# Patient Record
Sex: Male | Born: 1960
Health system: Southern US, Community
[De-identification: ages and names within clinical notes are randomized; demographics above are authoritative.]

## PROBLEM LIST (undated history)

## (undated) DIAGNOSIS — B029 Zoster without complications: Secondary | ICD-10-CM

## (undated) DIAGNOSIS — F329 Major depressive disorder, single episode, unspecified: Secondary | ICD-10-CM

## (undated) DIAGNOSIS — B019 Varicella without complication: Secondary | ICD-10-CM

## (undated) DIAGNOSIS — T783XXA Angioneurotic edema, initial encounter: Secondary | ICD-10-CM

## (undated) DIAGNOSIS — L509 Urticaria, unspecified: Secondary | ICD-10-CM

## (undated) DIAGNOSIS — E7800 Pure hypercholesterolemia, unspecified: Secondary | ICD-10-CM

## (undated) DIAGNOSIS — K589 Irritable bowel syndrome without diarrhea: Secondary | ICD-10-CM

## (undated) DIAGNOSIS — K760 Fatty (change of) liver, not elsewhere classified: Secondary | ICD-10-CM

## (undated) DIAGNOSIS — N200 Calculus of kidney: Secondary | ICD-10-CM

## (undated) DIAGNOSIS — R221 Localized swelling, mass and lump, neck: Secondary | ICD-10-CM

## (undated) DIAGNOSIS — M1990 Unspecified osteoarthritis, unspecified site: Secondary | ICD-10-CM

## (undated) DIAGNOSIS — C679 Malignant neoplasm of bladder, unspecified: Secondary | ICD-10-CM

## (undated) DIAGNOSIS — F32A Depression, unspecified: Secondary | ICD-10-CM

## (undated) DIAGNOSIS — R7303 Prediabetes: Secondary | ICD-10-CM

## (undated) DIAGNOSIS — R220 Localized swelling, mass and lump, head: Secondary | ICD-10-CM

## (undated) DIAGNOSIS — J45909 Unspecified asthma, uncomplicated: Secondary | ICD-10-CM

## (undated) HISTORY — DX: Localized swelling, mass and lump, neck: R22.1

## (undated) HISTORY — DX: Depression, unspecified: F32.A

## (undated) HISTORY — DX: Unspecified asthma, uncomplicated: J45.909

## (undated) HISTORY — DX: Zoster without complications: B02.9

## (undated) HISTORY — DX: Calculus of kidney: N20.0

## (undated) HISTORY — DX: Urticaria, unspecified: L50.9

## (undated) HISTORY — DX: Fatty (change of) liver, not elsewhere classified: K76.0

## (undated) HISTORY — DX: Varicella without complication: B01.9

## (undated) HISTORY — DX: Angioneurotic edema, initial encounter: T78.3XXA

## (undated) HISTORY — DX: Localized swelling, mass and lump, head: R22.0

## (undated) HISTORY — DX: Malignant neoplasm of bladder, unspecified: C67.9

## (undated) HISTORY — DX: Prediabetes: R73.03

---
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
## 1986-01-01 HISTORY — PX: OTHER SURGICAL HISTORY: SHX169

## 2008-09-10 ENCOUNTER — Ambulatory Visit (HOSPITAL_COMMUNITY): Admission: RE | Admit: 2008-09-10 | Discharge: 2008-09-10 | Payer: Self-pay | Admitting: Pulmonary Disease

## 2011-07-16 ENCOUNTER — Other Ambulatory Visit (HOSPITAL_COMMUNITY): Payer: Self-pay | Admitting: Family Medicine

## 2011-07-16 ENCOUNTER — Ambulatory Visit (HOSPITAL_COMMUNITY)
Admission: RE | Admit: 2011-07-16 | Discharge: 2011-07-16 | Disposition: A | Discharge status: Home / Self Care | Payer: BC Managed Care – PPO | Source: Ambulatory Visit | Attending: Family Medicine | Admitting: Family Medicine

## 2011-07-16 DIAGNOSIS — R0602 Shortness of breath: Secondary | ICD-10-CM

## 2011-07-16 DIAGNOSIS — R42 Dizziness and giddiness: Secondary | ICD-10-CM | POA: Insufficient documentation

## 2012-09-05 ENCOUNTER — Encounter (HOSPITAL_COMMUNITY): Payer: Self-pay | Admitting: Pharmacy Technician

## 2012-09-07 NOTE — H&P (Signed)
Lee Hughes is an 52 y.o. male.   Chief Complaint: Need for screening colonoscopy HPI: Patient is a 52 year old white male who presents for a screening colonoscopy. He denies any GI complaints. There is no family history of colon cancer.  No past medical history on file.  No past surgical history on file.  No family history on file. Social History:  has no tobacco, alcohol, and drug history on file.  Allergies: No Known Allergies  No prescriptions prior to admission    No results found for this or any previous visit (from the past 48 hour(s)). No results found.  Review of Systems  Constitutional: Negative.   Gastrointestinal: Negative.   All other systems reviewed and are negative.    There were no vitals taken for this visit. Physical Exam  Constitutional: He is oriented to person, place, and time. He appears well-developed and well-nourished.  HENT:  Head: Normocephalic and atraumatic.  Neck: Normal range of motion. Neck supple.  Cardiovascular: Normal rate, regular rhythm and normal heart sounds.   Respiratory: Effort normal and breath sounds normal.  GI: Soft. Bowel sounds are normal.  Neurological: He is alert and oriented to person, place, and time.  Skin: Skin is warm and dry.     Assessment/Plan Impression: Need for screening colonoscopy Plan: Scheduled for screening colonoscopy on 09/09/2012. The risks and benefits of the procedure were fully explained to the patient, who gave informed consent.  Lee Hughes A 09/07/2012, 11:02 AM

## 2012-09-09 ENCOUNTER — Ambulatory Visit (HOSPITAL_COMMUNITY)
Admission: RE | Admit: 2012-09-09 | Discharge: 2012-09-09 | Disposition: A | Discharge status: Home / Self Care | Payer: BC Managed Care – PPO | Source: Ambulatory Visit | Attending: General Surgery | Admitting: General Surgery

## 2012-09-09 ENCOUNTER — Encounter (HOSPITAL_COMMUNITY): Payer: Self-pay | Admitting: *Deleted

## 2012-09-09 ENCOUNTER — Encounter (HOSPITAL_COMMUNITY)
Admission: RE | Disposition: A | Discharge status: Home / Self Care | Payer: Self-pay | Source: Ambulatory Visit | Attending: General Surgery

## 2012-09-09 DIAGNOSIS — Z1211 Encounter for screening for malignant neoplasm of colon: Secondary | ICD-10-CM | POA: Insufficient documentation

## 2012-09-09 HISTORY — DX: Pure hypercholesterolemia, unspecified: E78.00

## 2012-09-09 HISTORY — PX: COLONOSCOPY: SHX5424

## 2012-09-09 HISTORY — DX: Irritable bowel syndrome, unspecified: K58.9

## 2012-09-09 SURGERY — COLONOSCOPY
Anesthesia: Moderate Sedation

## 2012-09-09 MED ORDER — MEPERIDINE HCL 50 MG/ML IJ SOLN
INTRAMUSCULAR | Status: DC | PRN
  Administered 2012-09-09: 50 mg via INTRAVENOUS

## 2012-09-09 MED ORDER — STERILE WATER FOR IRRIGATION IR SOLN
Status: DC | PRN
  Administered 2012-09-09: 09:00:00

## 2012-09-09 MED ORDER — MEPERIDINE HCL 100 MG/ML IJ SOLN
INTRAMUSCULAR | Status: AC
  Filled 2012-09-09: qty 1

## 2012-09-09 MED ORDER — MIDAZOLAM HCL 5 MG/5ML IJ SOLN
INTRAMUSCULAR | Status: AC
  Filled 2012-09-09: qty 10

## 2012-09-09 MED ORDER — MIDAZOLAM HCL 5 MG/5ML IJ SOLN
INTRAMUSCULAR | Status: DC | PRN
  Administered 2012-09-09: 4 mg via INTRAVENOUS

## 2012-09-09 MED ORDER — SODIUM CHLORIDE 0.9 % IV SOLN
INTRAVENOUS | Status: DC
  Administered 2012-09-09: 08:00:00 via INTRAVENOUS

## 2012-09-09 NOTE — Op Note (Signed)
[facility] [address] [place], [address]   COLONOSCOPY PROCEDURE REPORT  PATIENT: Lee Hughes, Lee Hughes  MR#: [id number] BIRTHDATE: [birth year] , 52  yrs. old GENDER: Male ENDOSCOPIST: Franky Macho, MD REFERRED XB:[id number], John PROCEDURE DATE:  09/09/2012 PROCEDURE:   Colonoscopy, screening ASA CLASS:   Class I INDICATIONS:Average risk patient for colon cancer. MEDICATIONS: Versed 4 mg IV and Demerol 50 mg IV  DESCRIPTION OF PROCEDURE:   After the risks benefits and alternatives of the procedure were thoroughly explained, informed consent was obtained.  A digital rectal exam revealed no abnormalities of the rectum.   The EC-[id number] ([id number])  endoscope was introduced through the anus and advanced to the cecum, which was identified by both the appendix and ileocecal valve. No adverse events experienced.   The quality of the prep was adequate, using MoviPrep  The instrument was then slowly withdrawn as the colon was fully examined.      COLON FINDINGS: A normal appearing cecum, ileocecal valve, and appendiceal orifice were identified.  The ascending, hepatic flexure, transverse, splenic flexure, descending, sigmoid colon and rectum appeared unremarkable.  No polyps or cancers were seen. Retroflexed views revealed no abnormalities. The time to cecum=4 minutes 0 seconds.  Withdrawal time=5 minutes 0 seconds.  The scope was withdrawn and the procedure completed. COMPLICATIONS: There were no complications.  ENDOSCOPIC IMPRESSION: Normal colon  RECOMMENDATIONS: Repeat Colonscopy in 10 years.   eSigned:  Franky Macho, MD 09/09/2012 8:51 AM   cc:

## 2012-09-09 NOTE — Interval H&P Note (Signed)
History and Physical Interval Note:  09/09/2012 8:39 AM  Lee Hughes  has presented today for surgery, with the diagnosis of screening  The various methods of treatment have been discussed with the patient and family. After consideration of risks, benefits and other options for treatment, the patient has consented to  Procedure(s): COLONOSCOPY (N/A) as a surgical intervention .  The patient's history has been reviewed, patient examined, no change in status, stable for surgery.  I have reviewed the patient's chart and labs.  Questions were answered to the patient's satisfaction.     Franky Macho A

## 2012-09-10 ENCOUNTER — Encounter (HOSPITAL_COMMUNITY): Payer: Self-pay | Admitting: General Surgery

## 2013-05-29 ENCOUNTER — Other Ambulatory Visit (HOSPITAL_COMMUNITY): Payer: Self-pay | Admitting: Family Medicine

## 2013-05-29 DIAGNOSIS — Q619 Cystic kidney disease, unspecified: Secondary | ICD-10-CM

## 2013-06-15 ENCOUNTER — Ambulatory Visit (HOSPITAL_COMMUNITY)
Admission: RE | Admit: 2013-06-15 | Discharge: 2013-06-15 | Disposition: A | Discharge status: Home / Self Care | Payer: BC Managed Care – PPO | Source: Ambulatory Visit | Attending: Family Medicine | Admitting: Family Medicine

## 2013-06-15 ENCOUNTER — Encounter (HOSPITAL_COMMUNITY): Payer: Self-pay

## 2013-06-15 DIAGNOSIS — Q619 Cystic kidney disease, unspecified: Secondary | ICD-10-CM

## 2013-06-15 DIAGNOSIS — K7689 Other specified diseases of liver: Secondary | ICD-10-CM | POA: Insufficient documentation

## 2013-06-15 DIAGNOSIS — N289 Disorder of kidney and ureter, unspecified: Secondary | ICD-10-CM | POA: Insufficient documentation

## 2013-06-15 DIAGNOSIS — Q6101 Congenital single renal cyst: Secondary | ICD-10-CM | POA: Insufficient documentation

## 2013-06-15 MED ORDER — IOHEXOL 300 MG/ML  SOLN
100.0000 mL | Freq: Once | INTRAMUSCULAR | Status: AC | PRN
  Administered 2013-06-15: 100 mL via INTRAVENOUS

## 2015-01-31 DIAGNOSIS — E7801 Familial hypercholesterolemia: Secondary | ICD-10-CM | POA: Insufficient documentation

## 2015-05-02 DIAGNOSIS — Z6827 Body mass index (BMI) 27.0-27.9, adult: Secondary | ICD-10-CM | POA: Diagnosis not present

## 2015-05-02 DIAGNOSIS — E781 Pure hyperglyceridemia: Secondary | ICD-10-CM | POA: Diagnosis not present

## 2015-05-02 DIAGNOSIS — N200 Calculus of kidney: Secondary | ICD-10-CM | POA: Diagnosis not present

## 2015-05-02 DIAGNOSIS — E663 Overweight: Secondary | ICD-10-CM | POA: Diagnosis not present

## 2015-05-02 DIAGNOSIS — Z1389 Encounter for screening for other disorder: Secondary | ICD-10-CM | POA: Diagnosis not present

## 2015-05-04 MED FILL — FENOFIBRATE 160 MG TABLET: 160 | 90 days supply | Qty: 90 | Fill #0

## 2015-05-13 MED FILL — PIOGLITAZONE HCL 30 MG TAB: 30 | 90 days supply | Qty: 90 | Fill #0

## 2015-05-13 MED FILL — OSCIMIN SR 0.375 MG TABLET: 0.375 | 60 days supply | Qty: 90 | Fill #0

## 2015-05-13 MED FILL — OMEGA-3 ETHYL ESTERS 1 GM C: 1 | 90 days supply | Qty: 180 | Fill #0

## 2015-05-17 MED FILL — NIACIN FLUSH FREE 500 MG CA: 400(500MG) | 20 days supply | Qty: 120 | Fill #0

## 2016-01-06 MED FILL — ROSUVASTATIN CALCIUM 5 MG T: 5 | 90 days supply | Qty: 90 | Fill #0

## 2016-01-06 MED FILL — OMEGA-3 ETHYL ESTERS 1 GM C: 1 | 90 days supply | Qty: 180 | Fill #1

## 2016-01-06 MED FILL — FENOFIBRATE 160 MG TABLET: 160 | 90 days supply | Qty: 90 | Fill #1

## 2016-01-06 MED FILL — PIOGLITAZONE HCL 30 MG TAB: 30 | 90 days supply | Qty: 90 | Fill #1

## 2016-01-06 MED FILL — OSCIMIN SR 0.375 MG TABLET: 0.375 | 90 days supply | Qty: 90 | Fill #1

## 2016-02-15 ENCOUNTER — Ambulatory Visit: Payer: Self-pay | Admitting: Allergy and Immunology

## 2016-03-19 ENCOUNTER — Ambulatory Visit (INDEPENDENT_AMBULATORY_CARE_PROVIDER_SITE_OTHER): Payer: 59 | Admitting: Allergy

## 2016-03-19 ENCOUNTER — Encounter: Payer: Self-pay | Admitting: Allergy

## 2016-03-19 VITALS — BP 114/72 | HR 86 | Temp 97.7°F | Resp 14 | Ht 71.0 in | Wt 203.0 lb

## 2016-03-19 DIAGNOSIS — T783XXA Angioneurotic edema, initial encounter: Secondary | ICD-10-CM

## 2016-03-19 DIAGNOSIS — L508 Other urticaria: Secondary | ICD-10-CM | POA: Diagnosis not present

## 2016-03-19 NOTE — Patient Instructions (Signed)
Hives and swelling       - at this time no identifiable trigger to hives and swelling.   This could be related to starting Crestor however with drug allergy would expect your to have daily symptoms within hours of taking the medications if you take it daily.  Would however recommend discussing with your PCP regarding continuation if below antihistamine regimen does not decrease your symptoms.          - will obtain following labs: CBC w diff, CMP, tryptase level, alpha gal panel, chronic hive panel and environmental allergen panel      - start a daily antihistamine like Zyrtec 10mg , Allergra 180mg  or Xyzal 5mg  daily.   If you continue to have hives and swelling increase to twice a day dosing (in AM and PM).   If twice a dosing is not enough add in either Zantac 150mg  or Pepcid 20mg  twice a day with your antihistamine.    Follow-up 2-3 months

## 2016-03-19 NOTE — Progress Notes (Signed)
New Patient Note  RE: Lee Hughes MRN: [id number] DOB: Jun 20, 1960 Date of Office Visit: 03/19/2016  Referring provider: Sharilyn Sites, MD Primary care provider: Purvis Kilts, MD  Chief Complaint: rash and swelling  History of present illness: Lee Hughes is a 56 y.o. male presenting today for consultation for hives and swelling.  He presents today with his wife.  His symptoms started in November 2017 with red raised itchy rash.  He is also on 4 separate occasions and has had lip and tongue swelling, runny nose and itchy eyes along with the rash.  He states he will have the rash several times a week.  He has only taking Benadryl when he has had swelling which does improve the swelling within about 30 minutes. He denies any trouble swallowing or breathing and no respiratory, GI or CV related symptoms.  Has has noticed that shower with warm-hot water makes the rash worse He has changed his soaps and detergents to fragrance-free and dye-free products and he continues to have the rash.  He is also concerned that he is reacting to Crestor as this wasn't restarted in November. He states he took the medicine for about a week before the rash started. He continues to take Crestor daily.  He was previously taking Repatha.  He denies any preceding illnessed, new foods, or stings.  No associated fevers, joint aches or pains and rash does not leave any marks or bruising.  He denies known tick bites but does work outdoors frequently.  He denies having hives or swelling prior to November.  He has provided pictures of the rash that is consistent with classic urticaria.   He has a history of childhood asthma.  Denies any significant ocular or nasal symptoms suggestive of allergic rhinoconjunctivitis outside of the 4 episodes as above.  Review of systems: Review of Systems  Constitutional: Negative for chills, fever and malaise/fatigue.  HENT: Positive for congestion. Negative for ear discharge, ear  pain, nosebleeds, sinus pain, sore throat and tinnitus.   Eyes: Negative for discharge and redness.  Respiratory: Negative for cough, shortness of breath and wheezing.   Cardiovascular: Negative for chest pain.  Gastrointestinal: Negative for abdominal pain, heartburn, nausea and vomiting.  Musculoskeletal: Negative for joint pain and myalgias.  Skin: Positive for itching and rash.  Neurological: Negative for headaches.    All other systems negative unless noted above in HPI  Past medical history: Past Medical History:  Diagnosis Date  . Angio-edema   . Asthma    as child, no longer problematic  . Hypercholesteremia   . Irritable bowel syndrome   . Throat swelling   . Tongue swelling   . Urticaria     Past surgical history: Past Surgical History:  Procedure Laterality Date  . COLONOSCOPY N/A 09/09/2012   Procedure: COLONOSCOPY;  Surgeon: Jamesetta So, MD;  Location: [facility];  Service: Gastroenterology;  Laterality: N/A;  . Left femur Left 1988    Family history:  Family History  Problem Relation Age of Onset  . Pancreatic cancer Brother   . Pancreatic cancer Brother   . Allergic rhinitis Neg Hx   . Angioedema Neg Hx   . Asthma Neg Hx   . Atopy Neg Hx   . Eczema Neg Hx   . Immunodeficiency Neg Hx   . Urticaria Neg Hx     Social history: He lives in a home without carpeting with central cooling. There is a cat in the home.  There is no concern for water damage, mildew or roaches the home. He works as a Estate manager/land agent. He has no history of tobacco use.  Medication List: Allergies as of 03/19/2016   No Known Allergies     Medication List       Accurate as of 03/19/16 10:28 AM. Always use your most recent med list.          aspirin 81 MG chewable tablet Chew 81 mg by mouth.   fenofibrate 160 MG tablet   LEVBID 0.375 MG 12 hr tablet Generic drug:  hyoscyamine Take 0.375 mg by mouth daily.   multivitamin capsule Take by mouth.   niacin 1000 MG CR  tablet Commonly known as:  NIASPAN Take 3,000 mg by mouth.   omega-3 acid ethyl esters 1 g capsule Commonly known as:  LOVAZA Take 2 g by mouth.   pioglitazone 30 MG tablet Commonly known as:  ACTOS Take 30 mg by mouth.   rosuvastatin 5 MG tablet Commonly known as:  CRESTOR       Known medication allergies: No Known Allergies   Physical examination: Blood pressure 114/72, pulse 86, temperature 97.7 F (36.5 C), temperature source Oral, resp. rate 14, height 5\' 11"  (1.803 m), weight 203 lb (92.1 kg), SpO2 97 %.  General: Alert, interactive, in no acute distress. HEENT: TMs pearly gray, turbinates minimally edematous without discharge, post-pharynx non erythematous. Neck: Supple without lymphadenopathy. Lungs: Clear to auscultation without wheezing, rhonchi or rales. {no increased work of breathing. CV: Normal S1, S2 without murmurs. Abdomen: Nondistended, nontender. Skin: Scattered erythematous urticarial type lesions primarily located Arms, neck and upper back , nonvesicular. Extremities:  No clubbing, cyanosis or edema. Neuro:   Grossly intact.  Diagnositics/Labs:  Allergy testing: Deferred due to ongoing urticaria   Assessment and plan: Urticaria with angioedema      - at this time no identifiable trigger to hives and swelling.   This could be related to starting Crestor however with drug allergy would expect you to have daily symptoms within hours of taking the medications if you take it daily.  Would however recommend discussing with your PCP regarding continuation if below antihistamine regimen does not decrease your symptoms.          - will obtain following labs: CBC w diff, CMP, tryptase level, alpha gal panel, chronic hive panel and environmental allergen panel      - start a daily antihistamine like Zyrtec 10mg , Allergra 180mg  or Xyzal 5mg  daily.   If you continue to have hives and swelling increase to twice a day dosing (in AM and PM).   If twice a dosing is  not enough add in either Zantac 150mg  or Pepcid 20mg  twice a day with your antihistamine.    Follow-up 2-3 months   I appreciate the opportunity to take part in Lee Hughes's care. Please do not hesitate to contact me with questions.  Sincerely,   Prudy Feeler, MD Allergy/Immunology [facility] [place]

## 2016-03-23 ENCOUNTER — Other Ambulatory Visit: Payer: Self-pay

## 2016-03-23 ENCOUNTER — Telehealth: Payer: Self-pay | Admitting: Allergy

## 2016-03-23 LAB — CBC WITH DIFFERENTIAL/PLATELET
BASOS: 1 %
Basophils Absolute: 0 10*3/uL (ref 0.0–0.2)
EOS (ABSOLUTE): 0.3 10*3/uL (ref 0.0–0.4)
EOS: 6 %
HEMATOCRIT: 43.1 % (ref 37.5–51.0)
HEMOGLOBIN: 13.9 g/dL (ref 13.0–17.7)
IMMATURE GRANS (ABS): 0 10*3/uL (ref 0.0–0.1)
Immature Granulocytes: 0 %
Lymphocytes Absolute: 1.6 10*3/uL (ref 0.7–3.1)
Lymphs: 31 %
MCH: 25.8 pg — AB (ref 26.6–33.0)
MCHC: 32.3 g/dL (ref 31.5–35.7)
MCV: 80 fL (ref 79–97)
Monocytes Absolute: 0.5 10*3/uL (ref 0.1–0.9)
Monocytes: 9 %
NEUTROS ABS: 2.9 10*3/uL (ref 1.4–7.0)
Neutrophils: 53 %
Platelets: 297 10*3/uL (ref 150–379)
RBC: 5.39 x10E6/uL (ref 4.14–5.80)
RDW: 15.2 % (ref 12.3–15.4)
WBC: 5.4 10*3/uL (ref 3.4–10.8)

## 2016-03-23 LAB — ALLERGENS, ZONE 3
Cat Dander IgE: 34.2 kU/L — AB
Cladosporium Herbarum IgE: <0.1 kU/L
D Farinae IgE: <0.1 kU/L
D Pteronyssinus IgE: <0.1 kU/L
Dog Dander IgE: 13.8 kU/L — AB
Elm, American IgE: <0.1 kU/L
G002-IGE BERMUDA GRASS: 0.63 kU/L — AB
G008-IGE BLUEGRASS, KENTUCK: 1.47 kU/L — AB
G017-IGE BAHIA GRASS: 0.6 kU/L — AB
Hickory, White IgE: <0.1 kU/L
Johnson Grass IgE: 0.49 kU/L — AB
Oak, White IgE: 0.16 kU/L — AB
Penicillium Chrysogen IgE: <0.1 kU/L
Pigweed, Rough IgE: <0.1 kU/L
T003-IGE COMMON SILVER BIRCH: 0.53 kU/L — AB
T006-IGE CEDAR, MOUNTAIN: 1.85 kU/L — AB
White Mulberry IgE: <0.1 kU/L

## 2016-03-23 LAB — COMPREHENSIVE METABOLIC PANEL
A/G RATIO: 1.8 (ref 1.2–2.2)
ALT: 24 IU/L (ref 0–44)
AST: 28 IU/L (ref 0–40)
Albumin: 4.5 g/dL (ref 3.5–5.5)
Alkaline Phosphatase: 50 IU/L (ref 39–117)
BUN/Creatinine Ratio: 7 — ABNORMAL LOW (ref 9–20)
BUN: 9 mg/dL (ref 6–24)
Bilirubin Total: 0.3 mg/dL (ref 0.0–1.2)
CALCIUM: 9.9 mg/dL (ref 8.7–10.2)
CO2: 23 mmol/L (ref 18–29)
CREATININE: 1.27 mg/dL (ref 0.76–1.27)
Chloride: 102 mmol/L (ref 96–106)
GFR, EST AFRICAN AMERICAN: 74 mL/min/{1.73_m2} (ref >59)
GFR, EST NON AFRICAN AMERICAN: 64 mL/min/{1.73_m2} (ref >59)
GLOBULIN, TOTAL: 2.5 g/dL (ref 1.5–4.5)
GLUCOSE: 85 mg/dL (ref 65–99)
Potassium: 4.3 mmol/L (ref 3.5–5.2)
Sodium: 143 mmol/L (ref 134–144)
TOTAL PROTEIN: 7 g/dL (ref 6.0–8.5)

## 2016-03-23 LAB — ALPHA-GAL PANEL
Alpha Gal IgE*: <0.1 kU/L (ref <0.35)
BEEF CLASS INTERPRETATION: 1
Beef (Bos spp) IgE: 0.59 kU/L — ABNORMAL HIGH (ref <0.35)
LAMB CLASS INTERPRETATION: 3
LAMB/MUTTON (OVIS SPP) IGE: 7.91 kU/L — AB (ref <0.35)
PORK (SUS SPP) IGE: 8.81 kU/L — AB (ref <0.35)
PORK CLASS INTERPRETATION: 3

## 2016-03-23 LAB — TRYPTASE: Tryptase: 6.8 ug/L (ref 2.2–13.2)

## 2016-03-23 LAB — CHRONIC URTICARIA: cu index: 7.9 (ref <10)

## 2016-03-23 MED ORDER — EPINEPHRINE 0.3 MG/0.3ML IJ SOAJ: 0.3000 mg | Freq: Once | INTRAMUSCULAR | 2 refills | Status: AC

## 2016-03-23 MED FILL — EPINEPHRINE 0.3 MG AUTO-INJ: 0.3 | 30 days supply | Qty: 2 | Fill #0

## 2016-03-23 NOTE — Telephone Encounter (Signed)
Lm for pt to call us back about labs

## 2016-03-23 NOTE — Telephone Encounter (Signed)
Patient is returning a call. He doesn't know who called. He said it is probably about results.

## 2016-03-26 NOTE — Telephone Encounter (Signed)
See result notes. 

## 2016-04-03 MED FILL — ROSUVASTATIN CALCIUM 5 MG T: 5 | 90 days supply | Qty: 90 | Fill #1

## 2016-04-03 MED FILL — PIOGLITAZONE HCL 30 MG TAB: 30 | 90 days supply | Qty: 90 | Fill #2

## 2016-04-03 MED FILL — OSCIMIN SR 0.375 MG TABLET: 0.375 | 90 days supply | Qty: 90 | Fill #2

## 2016-04-03 MED FILL — FENOFIBRATE 160 MG TABLET: 160 | 90 days supply | Qty: 90 | Fill #2

## 2016-04-03 MED FILL — OMEGA-3 ETHYL ESTERS 1 GM C: 1 | 90 days supply | Qty: 180 | Fill #2

## 2016-05-24 ENCOUNTER — Other Ambulatory Visit: Payer: Self-pay | Admitting: General Surgery

## 2016-05-24 DIAGNOSIS — E7801 Familial hypercholesterolemia: Secondary | ICD-10-CM | POA: Diagnosis not present

## 2016-05-25 LAB — COMPREHENSIVE METABOLIC PANEL
ALT: 23 IU/L (ref 0–44)
AST: 29 IU/L (ref 0–40)
Albumin/Globulin Ratio: 1.7 (ref 1.2–2.2)
Albumin: 4.4 g/dL (ref 3.5–5.5)
Alkaline Phosphatase: 53 IU/L (ref 39–117)
BUN/Creatinine Ratio: 17 (ref 9–20)
BUN: 21 mg/dL (ref 6–24)
Bilirubin Total: 0.2 mg/dL (ref 0.0–1.2)
CALCIUM: 10.5 mg/dL — AB (ref 8.7–10.2)
CO2: 26 mmol/L (ref 18–29)
CREATININE: 1.23 mg/dL (ref 0.76–1.27)
Chloride: 102 mmol/L (ref 96–106)
GFR calc Af Amer: 77 mL/min/{1.73_m2} (ref >59)
GFR calc non Af Amer: 67 mL/min/{1.73_m2} (ref >59)
GLUCOSE: 89 mg/dL (ref 65–99)
Globulin, Total: 2.6 g/dL (ref 1.5–4.5)
Potassium: 4.5 mmol/L (ref 3.5–5.2)
Sodium: 142 mmol/L (ref 134–144)
Total Protein: 7 g/dL (ref 6.0–8.5)

## 2016-05-25 LAB — CBC WITH DIFFERENTIAL/PLATELET
BASOS ABS: 0 10*3/uL (ref 0.0–0.2)
Basos: 1 %
EOS (ABSOLUTE): 0.3 10*3/uL (ref 0.0–0.4)
EOS: 7 %
HEMATOCRIT: 43.5 % (ref 37.5–51.0)
Hemoglobin: 14 g/dL (ref 13.0–17.7)
IMMATURE GRANULOCYTES: 0 %
Immature Grans (Abs): 0 10*3/uL (ref 0.0–0.1)
Lymphocytes Absolute: 1.9 10*3/uL (ref 0.7–3.1)
Lymphs: 37 %
MCH: 25.9 pg — ABNORMAL LOW (ref 26.6–33.0)
MCHC: 32.2 g/dL (ref 31.5–35.7)
MCV: 80 fL (ref 79–97)
MONOS ABS: 0.5 10*3/uL (ref 0.1–0.9)
Monocytes: 10 %
NEUTROS PCT: 45 %
Neutrophils Absolute: 2.3 10*3/uL (ref 1.4–7.0)
PLATELETS: 300 10*3/uL (ref 150–379)
RBC: 5.41 x10E6/uL (ref 4.14–5.80)
RDW: 15.4 % (ref 12.3–15.4)
WBC: 5.1 10*3/uL (ref 3.4–10.8)

## 2016-05-25 LAB — HGB A1C W/O EAG: Hgb A1c MFr Bld: 5.6 % (ref 4.8–5.6)

## 2016-05-25 LAB — LIPID PANEL W/O CHOL/HDL RATIO
CHOLESTEROL TOTAL: 236 mg/dL — AB (ref 100–199)
HDL: 40 mg/dL (ref >39)
Triglycerides: 437 mg/dL — ABNORMAL HIGH (ref 0–149)

## 2016-05-30 DIAGNOSIS — D899 Disorder involving the immune mechanism, unspecified: Secondary | ICD-10-CM | POA: Diagnosis not present

## 2016-05-30 DIAGNOSIS — E7801 Familial hypercholesterolemia: Secondary | ICD-10-CM | POA: Diagnosis not present

## 2016-06-19 ENCOUNTER — Ambulatory Visit: Payer: Self-pay | Admitting: Allergy & Immunology

## 2016-06-19 DIAGNOSIS — J309 Allergic rhinitis, unspecified: Secondary | ICD-10-CM

## 2016-07-09 MED FILL — ROSUVASTATIN CALCIUM 5 MG T: 5 | 90 days supply | Qty: 90 | Fill #2

## 2016-07-11 MED FILL — PIOGLITAZONE HCL 30 MG TAB: 30 | 90 days supply | Qty: 90 | Fill #0

## 2016-07-11 MED FILL — OSCIMIN SR 0.375 MG TABLET: 0.375 | 90 days supply | Qty: 90 | Fill #0

## 2016-07-11 MED FILL — FENOFIBRATE 160 MG TABLET: 160 | 90 days supply | Qty: 90 | Fill #0

## 2016-07-11 MED FILL — OMEGA-3 ETHYL ESTERS 1 GM C: 1 | 90 days supply | Qty: 180 | Fill #0

## 2016-09-27 DIAGNOSIS — E781 Pure hyperglyceridemia: Secondary | ICD-10-CM | POA: Diagnosis not present

## 2016-09-27 DIAGNOSIS — Z6827 Body mass index (BMI) 27.0-27.9, adult: Secondary | ICD-10-CM | POA: Diagnosis not present

## 2016-09-27 DIAGNOSIS — E663 Overweight: Secondary | ICD-10-CM | POA: Diagnosis not present

## 2016-09-27 DIAGNOSIS — Z23 Encounter for immunization: Secondary | ICD-10-CM | POA: Diagnosis not present

## 2016-09-27 DIAGNOSIS — Z1389 Encounter for screening for other disorder: Secondary | ICD-10-CM | POA: Diagnosis not present

## 2016-09-27 DIAGNOSIS — Z0001 Encounter for general adult medical examination with abnormal findings: Secondary | ICD-10-CM | POA: Diagnosis not present

## 2016-09-27 DIAGNOSIS — R310 Gross hematuria: Secondary | ICD-10-CM | POA: Diagnosis not present

## 2016-09-27 DIAGNOSIS — E782 Mixed hyperlipidemia: Secondary | ICD-10-CM | POA: Diagnosis not present

## 2016-10-03 MED FILL — OSCIMIN SR 0.375 MG TABLET: 0.375 | 90 days supply | Qty: 90 | Fill #0

## 2016-10-08 MED FILL — ROSUVASTATIN CALCIUM 5 MG T: 5 | 90 days supply | Qty: 90 | Fill #0

## 2016-10-08 MED FILL — FENOFIBRATE 160 MG TABLET: 160 | 90 days supply | Qty: 90 | Fill #0

## 2016-10-08 MED FILL — PIOGLITAZONE HCL 30 MG TAB: 30 | 90 days supply | Qty: 90 | Fill #0

## 2016-10-08 MED FILL — OMEGA-3 ETHYL ESTERS 1 GM C: 1 | 90 days supply | Qty: 180 | Fill #0

## 2016-11-02 ENCOUNTER — Ambulatory Visit (INDEPENDENT_AMBULATORY_CARE_PROVIDER_SITE_OTHER): Payer: 59 | Admitting: Urology

## 2016-11-02 ENCOUNTER — Other Ambulatory Visit (HOSPITAL_COMMUNITY)
Admission: RE | Admit: 2016-11-02 | Discharge: 2016-11-02 | Disposition: A | Discharge status: Home / Self Care | Payer: 59 | Source: Other Acute Inpatient Hospital | Attending: Urology | Admitting: Urology

## 2016-11-02 DIAGNOSIS — N5201 Erectile dysfunction due to arterial insufficiency: Secondary | ICD-10-CM | POA: Diagnosis not present

## 2016-11-02 DIAGNOSIS — Z87442 Personal history of urinary calculi: Secondary | ICD-10-CM | POA: Diagnosis not present

## 2016-11-02 DIAGNOSIS — R310 Gross hematuria: Secondary | ICD-10-CM | POA: Diagnosis not present

## 2016-11-02 LAB — URINALYSIS, ROUTINE W REFLEX MICROSCOPIC
Bilirubin Urine: NEGATIVE
Glucose, UA: NEGATIVE mg/dL
KETONES UR: NEGATIVE mg/dL
Leukocytes, UA: NEGATIVE
Nitrite: NEGATIVE
PH: 5 (ref 5.0–8.0)
Protein, ur: NEGATIVE mg/dL
SPECIFIC GRAVITY, URINE: 1.018 (ref 1.005–1.030)
Squamous Epithelial / LPF: NONE SEEN

## 2016-11-06 ENCOUNTER — Other Ambulatory Visit: Payer: Self-pay | Admitting: Urology

## 2016-11-06 DIAGNOSIS — R310 Gross hematuria: Secondary | ICD-10-CM

## 2016-11-12 ENCOUNTER — Ambulatory Visit (HOSPITAL_COMMUNITY)
Admission: RE | Admit: 2016-11-12 | Discharge: 2016-11-12 | Disposition: A | Discharge status: Home / Self Care | Payer: 59 | Source: Ambulatory Visit | Attending: Urology | Admitting: Urology

## 2016-11-12 DIAGNOSIS — I700 Atherosclerosis of aorta: Secondary | ICD-10-CM | POA: Insufficient documentation

## 2016-11-12 DIAGNOSIS — N329 Bladder disorder, unspecified: Secondary | ICD-10-CM | POA: Insufficient documentation

## 2016-11-12 DIAGNOSIS — R310 Gross hematuria: Secondary | ICD-10-CM | POA: Diagnosis not present

## 2016-11-12 DIAGNOSIS — Z87442 Personal history of urinary calculi: Secondary | ICD-10-CM | POA: Insufficient documentation

## 2016-11-12 DIAGNOSIS — K5730 Diverticulosis of large intestine without perforation or abscess without bleeding: Secondary | ICD-10-CM | POA: Diagnosis not present

## 2016-11-12 DIAGNOSIS — N200 Calculus of kidney: Secondary | ICD-10-CM | POA: Insufficient documentation

## 2016-11-12 MED ORDER — IOPAMIDOL (ISOVUE-300) INJECTION 61%
125.0000 mL | Freq: Once | INTRAVENOUS | Status: AC | PRN
  Administered 2016-11-12: 125 mL via INTRAVENOUS

## 2016-11-16 ENCOUNTER — Other Ambulatory Visit (HOSPITAL_COMMUNITY)
Admission: RE | Admit: 2016-11-16 | Discharge: 2016-11-16 | Disposition: A | Discharge status: Home / Self Care | Payer: 59 | Source: Ambulatory Visit | Attending: Urology | Admitting: Urology

## 2016-11-16 ENCOUNTER — Ambulatory Visit (INDEPENDENT_AMBULATORY_CARE_PROVIDER_SITE_OTHER): Payer: 59 | Admitting: Urology

## 2016-11-16 DIAGNOSIS — N200 Calculus of kidney: Secondary | ICD-10-CM | POA: Diagnosis not present

## 2016-11-16 DIAGNOSIS — R310 Gross hematuria: Secondary | ICD-10-CM | POA: Diagnosis not present

## 2016-11-16 DIAGNOSIS — D494 Neoplasm of unspecified behavior of bladder: Secondary | ICD-10-CM | POA: Insufficient documentation

## 2016-11-16 DIAGNOSIS — H5203 Hypermetropia, bilateral: Secondary | ICD-10-CM | POA: Diagnosis not present

## 2016-11-16 DIAGNOSIS — H52223 Regular astigmatism, bilateral: Secondary | ICD-10-CM | POA: Diagnosis not present

## 2016-11-16 DIAGNOSIS — H524 Presbyopia: Secondary | ICD-10-CM | POA: Diagnosis not present

## 2016-11-19 ENCOUNTER — Other Ambulatory Visit: Payer: Self-pay | Admitting: Urology

## 2016-12-03 DIAGNOSIS — E7801 Familial hypercholesterolemia: Secondary | ICD-10-CM | POA: Diagnosis not present

## 2016-12-13 ENCOUNTER — Other Ambulatory Visit: Payer: Self-pay

## 2016-12-13 ENCOUNTER — Encounter (HOSPITAL_BASED_OUTPATIENT_CLINIC_OR_DEPARTMENT_OTHER): Payer: Self-pay

## 2016-12-13 NOTE — Progress Notes (Signed)
Spoke with Lee Hughes  NPO after midnight.   Arrival time: 53 Labs: ISTAT4 AM Medications: None Pre op orders present Ride home: Lee Hughes (wife) [phone]

## 2016-12-17 NOTE — H&P (Signed)
CC/HPI: I have blood in my urine.     11/16/16: Truth returns today in f/u from his CT scan for gross hematuria. The study showed bilateral renal stones with the largest a 71mm LLP stone but he was also found to have a 1.4cm lesion in the area of the left UO that is suspicious for neoplasm. There was distrophic calcification. His UA still has some blood today.   GU Hx: Lee Hughes is a 56 yo WM who is sent in consultation by Dr. Gerarda Fraction. He presents with the complaint gross hematuria after heavy yard work. It occurred several times this summer. There was a large amount of blood that would clear overnight. His UA has 3+ blood today. He has a history of stones in the past but had mild intermittent left flank pain with the hematuria but he has some dysuria. The last episode was 3-4 weeks ago. He has nocturia x 2-3 which is chronic. He has no urgency. He has a good stream. He has not had a stone in several years but has had 4-5 and he had ureteroscopy x 1 after a failed ESWL in the 90's. He has no other GU history. He has had no nausea or fever. His PSA in 2017 was 1.2. He had a CT in 2015 and had a right renal cyst. He also has a history of ED and hypogonadism and has been on TRT briefly in the past.    CC: AUA Questions Scoring.  HPI: Lee Hughes is a 56 year-old male established patient who is here AUA Questions.      AUA Symptom Score: 50% of the time he has the sensation of not emptying his bladder completely when finished urinating. More than 50% of the time he has to urinate again fewer than two hours after he has finished urinating. Less than 20% of the time he has to start and stop again several times when he urinates. More than 50% of the time he finds it difficult to postpone urination. Less than 50% of the time he has a weak urinary stream. He never has to push or strain to begin urination. He has to get up to urinate 2 times from the time he goes to bed until the time he gets up in the morning.    Calculated AUA Symptom Score: 16    ALLERGIES: Red Meat    MEDICATIONS: Aspirin  Zyrtec  Fenofibrate  Multivitamin  Niacin  Omega 3  Oscimin  Pioglitazone Hcl  Rosuvastatin Calcium     GU PSH: Cystoscopy Ureteroscopy ESWL      PSH Notes: Broken femur   NON-GU PSH: None   GU PMH: ED due to arterial insufficiency - 11/02/2016 Gross hematuria - 11/02/2016 History of urolithiasis      PMH Notes: alpha gal allergy   NON-GU PMH: Arthritis Cardiac murmur, unspecified Hypercholesterolemia    FAMILY HISTORY: liver cancer - Brother pancreatic cancer - Brother renal cancer - Brother   SOCIAL HISTORY: Marital Status: Married Preferred Language: English; Ethnicity: Not Hispanic Or Latino; Race: White Current Smoking Status: Patient has never smoked.   Tobacco Use Assessment Completed: Used Tobacco in last 30 days? Social Drinker.  Drinks 4+ caffeinated drinks per day. Patient's occupation International aid/development worker.     Notes: 1 son 1 daughter   REVIEW OF SYSTEMS:    GU Review Male:   Patient reports frequent urination, burning/ pain with urination, get up at night to urinate, and erection problems. Patient denies hard to postpone urination,  leakage of urine, stream starts and stops, trouble starting your stream, have to strain to urinate , and penile pain.  Gastrointestinal (Upper):   Patient denies nausea, vomiting, and indigestion/ heartburn.  Gastrointestinal (Lower):   Patient denies diarrhea and constipation.  Constitutional:   Patient denies fever, night sweats, weight loss, and fatigue.  Skin:   Patient denies itching and skin rash/ lesion.  Eyes:   Patient denies blurred vision and double vision.  Ears/ Nose/ Throat:   Patient denies sore throat and sinus problems.  Hematologic/Lymphatic:   Patient denies swollen glands and easy bruising.  Cardiovascular:   Patient denies leg swelling and chest pains.  Respiratory:   Patient denies cough and shortness of breath.   Endocrine:   Patient denies excessive thirst.  Musculoskeletal:   Patient denies back pain and joint pain.  Neurological:   Patient denies headaches and dizziness.  Psychologic:   Patient denies depression and anxiety.   VITAL SIGNS:      11/16/2016 11:02 AM  BP 121/72 mmHg  Heart Rate 57 /min  Temperature 98.1 F / 36.7 C   MULTI-SYSTEM PHYSICAL EXAMINATION:    Constitutional: Well-nourished. No physical deformities. Normally developed. Good grooming.   Respiratory: No labored breathing, no use of accessory muscles. CTA  Cardiovascular: Normal temperature, RRR without murmur     PAST DATA REVIEWED:  Source Of History:  Patient  Records Review:   AUA Symptom Score  Urine Test Review:   Urinalysis  X-Ray Review: C.T. Abdomen/Pelvis: Reviewed Films. Reviewed Report. Discussed With Patient. renal stones and probable bladder tumor at the left trigone.     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 I am going to get him set up for cystoscopy with TURBT, possible left stent and possible epirubicin. I reviewed the risks of bleeding, infection, bladder wall injury, ureteral and urethral strictures, need for secondary procedures, chemical cystitis, thrombotic events and anesthetic complications.   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 He is on Actos for his diabetes and there is an association with actos and bladder tumors so he should be given a different med.   3   Renal calculus - N20.0 He has asymptomatic stones primarily in  the LLP.    PLAN:           Orders Labs Urine Cytology          Schedule Return Visit/Planned Activity: Next Available Appointment - Schedule Surgery

## 2016-12-18 ENCOUNTER — Ambulatory Visit (HOSPITAL_BASED_OUTPATIENT_CLINIC_OR_DEPARTMENT_OTHER): Payer: 59 | Admitting: Anesthesiology

## 2016-12-18 ENCOUNTER — Encounter (HOSPITAL_BASED_OUTPATIENT_CLINIC_OR_DEPARTMENT_OTHER): Payer: Self-pay | Admitting: Anesthesiology

## 2016-12-18 ENCOUNTER — Encounter (HOSPITAL_BASED_OUTPATIENT_CLINIC_OR_DEPARTMENT_OTHER)
Admission: RE | Disposition: A | Discharge status: Home / Self Care | Payer: Self-pay | Source: Ambulatory Visit | Attending: Urology

## 2016-12-18 ENCOUNTER — Ambulatory Visit (HOSPITAL_BASED_OUTPATIENT_CLINIC_OR_DEPARTMENT_OTHER)
Admission: RE | Admit: 2016-12-18 | Discharge: 2016-12-18 | Disposition: A | Discharge status: Home / Self Care | Payer: 59 | Source: Ambulatory Visit | Attending: Urology | Admitting: Urology

## 2016-12-18 DIAGNOSIS — C672 Malignant neoplasm of lateral wall of bladder: Secondary | ICD-10-CM | POA: Diagnosis not present

## 2016-12-18 DIAGNOSIS — R310 Gross hematuria: Secondary | ICD-10-CM | POA: Diagnosis present

## 2016-12-18 DIAGNOSIS — Z8051 Family history of malignant neoplasm of kidney: Secondary | ICD-10-CM | POA: Diagnosis not present

## 2016-12-18 DIAGNOSIS — Z91018 Allergy to other foods: Secondary | ICD-10-CM | POA: Insufficient documentation

## 2016-12-18 DIAGNOSIS — Z87442 Personal history of urinary calculi: Secondary | ICD-10-CM | POA: Insufficient documentation

## 2016-12-18 DIAGNOSIS — E119 Type 2 diabetes mellitus without complications: Secondary | ICD-10-CM | POA: Insufficient documentation

## 2016-12-18 DIAGNOSIS — D494 Neoplasm of unspecified behavior of bladder: Secondary | ICD-10-CM | POA: Diagnosis not present

## 2016-12-18 DIAGNOSIS — E7800 Pure hypercholesterolemia, unspecified: Secondary | ICD-10-CM | POA: Diagnosis not present

## 2016-12-18 DIAGNOSIS — Z7984 Long term (current) use of oral hypoglycemic drugs: Secondary | ICD-10-CM | POA: Insufficient documentation

## 2016-12-18 DIAGNOSIS — Z7982 Long term (current) use of aspirin: Secondary | ICD-10-CM | POA: Diagnosis not present

## 2016-12-18 DIAGNOSIS — Z800 Family history of malignant neoplasm of digestive organs: Secondary | ICD-10-CM | POA: Insufficient documentation

## 2016-12-18 DIAGNOSIS — Z8042 Family history of malignant neoplasm of prostate: Secondary | ICD-10-CM | POA: Diagnosis not present

## 2016-12-18 DIAGNOSIS — N200 Calculus of kidney: Secondary | ICD-10-CM | POA: Diagnosis not present

## 2016-12-18 DIAGNOSIS — M1990 Unspecified osteoarthritis, unspecified site: Secondary | ICD-10-CM | POA: Diagnosis not present

## 2016-12-18 DIAGNOSIS — Z9889 Other specified postprocedural states: Secondary | ICD-10-CM | POA: Insufficient documentation

## 2016-12-18 HISTORY — DX: Unspecified osteoarthritis, unspecified site: M19.90

## 2016-12-18 HISTORY — PX: TRANSURETHRAL RESECTION OF BLADDER TUMOR: SHX2575

## 2016-12-18 LAB — POCT I-STAT 4, (NA,K, GLUC, HGB,HCT)
Glucose, Bld: 94 mg/dL (ref 65–99)
HEMATOCRIT: 42 % (ref 39.0–52.0)
HEMOGLOBIN: 14.3 g/dL (ref 13.0–17.0)
POTASSIUM: 3.9 mmol/L (ref 3.5–5.1)
SODIUM: 145 mmol/L (ref 135–145)

## 2016-12-18 SURGERY — TURBT (TRANSURETHRAL RESECTION OF BLADDER TUMOR)
Anesthesia: General | Site: Bladder

## 2016-12-18 MED ORDER — SODIUM CHLORIDE 0.9 % IV SOLN
50.0000 mg | Freq: Once | INTRAVENOUS | Status: AC
  Administered 2016-12-18: 50 mg via INTRAVESICAL
  Filled 2016-12-18: qty 25

## 2016-12-18 MED ORDER — ACETAMINOPHEN 160 MG/5ML PO SOLN
ORAL | Status: AC
  Filled 2016-12-18: qty 20.3

## 2016-12-18 MED ORDER — PROPOFOL 10 MG/ML IV BOLUS
INTRAVENOUS | Status: DC | PRN
  Administered 2016-12-18: 200 mg via INTRAVENOUS

## 2016-12-18 MED ORDER — ROCURONIUM BROMIDE 50 MG/5ML IV SOSY
PREFILLED_SYRINGE | INTRAVENOUS | Status: DC | PRN
  Administered 2016-12-18: 50 mg via INTRAVENOUS

## 2016-12-18 MED ORDER — TRAMADOL HCL 50 MG PO TABS: 50.0000 mg | ORAL_TABLET | Freq: Four times a day (QID) | ORAL | 0 refills | Status: DC | PRN

## 2016-12-18 MED ORDER — MIDAZOLAM HCL 2 MG/2ML IJ SOLN
INTRAMUSCULAR | Status: AC
  Filled 2016-12-18: qty 2

## 2016-12-18 MED ORDER — OXYCODONE HCL 5 MG PO TABS
5.0000 mg | ORAL_TABLET | ORAL | Status: DC | PRN
  Filled 2016-12-18: qty 2

## 2016-12-18 MED ORDER — DEXAMETHASONE SODIUM PHOSPHATE 10 MG/ML IJ SOLN
INTRAMUSCULAR | Status: AC
  Filled 2016-12-18: qty 1

## 2016-12-18 MED ORDER — FENTANYL CITRATE (PF) 100 MCG/2ML IJ SOLN
INTRAMUSCULAR | Status: DC | PRN
  Administered 2016-12-18 (×2): 50 ug via INTRAVENOUS

## 2016-12-18 MED ORDER — MIDAZOLAM HCL 2 MG/2ML IJ SOLN
INTRAMUSCULAR | Status: DC | PRN
  Administered 2016-12-18: 2 mg via INTRAVENOUS

## 2016-12-18 MED ORDER — MORPHINE SULFATE (PF) 2 MG/ML IV SOLN
2.0000 mg | INTRAVENOUS | Status: DC | PRN
  Filled 2016-12-18: qty 1

## 2016-12-18 MED ORDER — FENTANYL CITRATE (PF) 100 MCG/2ML IJ SOLN
25.0000 ug | INTRAMUSCULAR | Status: DC | PRN
  Filled 2016-12-18: qty 1

## 2016-12-18 MED ORDER — PROPOFOL 10 MG/ML IV BOLUS
INTRAVENOUS | Status: AC
  Filled 2016-12-18: qty 40

## 2016-12-18 MED ORDER — SODIUM CHLORIDE 0.9% FLUSH
3.0000 mL | Freq: Two times a day (BID) | INTRAVENOUS | Status: DC
  Filled 2016-12-18: qty 3

## 2016-12-18 MED ORDER — SODIUM CHLORIDE 0.9 % IR SOLN
Status: DC | PRN
  Administered 2016-12-18: 3000 mL

## 2016-12-18 MED ORDER — ONDANSETRON HCL 4 MG/2ML IJ SOLN
INTRAMUSCULAR | Status: DC | PRN
  Administered 2016-12-18: 4 mg via INTRAVENOUS

## 2016-12-18 MED ORDER — LACTATED RINGERS IV SOLN
INTRAVENOUS | Status: DC
  Administered 2016-12-18: 12:00:00 via INTRAVENOUS
  Administered 2016-12-18: 1000 mL via INTRAVENOUS
  Filled 2016-12-18: qty 1000

## 2016-12-18 MED ORDER — FENTANYL CITRATE (PF) 100 MCG/2ML IJ SOLN
INTRAMUSCULAR | Status: AC
  Filled 2016-12-18: qty 2

## 2016-12-18 MED ORDER — ROCURONIUM BROMIDE 50 MG/5ML IV SOSY
PREFILLED_SYRINGE | INTRAVENOUS | Status: AC
  Filled 2016-12-18: qty 5

## 2016-12-18 MED ORDER — ONDANSETRON HCL 4 MG/2ML IJ SOLN
INTRAMUSCULAR | Status: AC
  Filled 2016-12-18: qty 2

## 2016-12-18 MED ORDER — DEXAMETHASONE SODIUM PHOSPHATE 10 MG/ML IJ SOLN
INTRAMUSCULAR | Status: DC | PRN
  Administered 2016-12-18: 10 mg via INTRAVENOUS

## 2016-12-18 MED ORDER — SUGAMMADEX SODIUM 200 MG/2ML IV SOLN
INTRAVENOUS | Status: AC
  Filled 2016-12-18: qty 2

## 2016-12-18 MED ORDER — SODIUM CHLORIDE 0.9% FLUSH
3.0000 mL | INTRAVENOUS | Status: DC | PRN
  Filled 2016-12-18: qty 3

## 2016-12-18 MED ORDER — CEFAZOLIN SODIUM-DEXTROSE 2-4 GM/100ML-% IV SOLN
INTRAVENOUS | Status: AC
  Filled 2016-12-18: qty 100

## 2016-12-18 MED ORDER — ACETAMINOPHEN 160 MG/5ML PO SOLN
960.0000 mg | Freq: Once | ORAL | Status: AC
  Administered 2016-12-18: 960 mg via ORAL
  Filled 2016-12-18: qty 40.6

## 2016-12-18 MED ORDER — LIDOCAINE 2% (20 MG/ML) 5 ML SYRINGE
INTRAMUSCULAR | Status: DC | PRN
  Administered 2016-12-18: 100 mg via INTRAVENOUS

## 2016-12-18 MED ORDER — SODIUM CHLORIDE 0.9 % IV SOLN
250.0000 mL | INTRAVENOUS | Status: DC | PRN
  Filled 2016-12-18: qty 250

## 2016-12-18 MED ORDER — ACETAMINOPHEN 650 MG RE SUPP
650.0000 mg | RECTAL | Status: DC | PRN
  Filled 2016-12-18: qty 1

## 2016-12-18 MED ORDER — ACETAMINOPHEN 325 MG PO TABS
650.0000 mg | ORAL_TABLET | ORAL | Status: DC | PRN
  Filled 2016-12-18: qty 2

## 2016-12-18 MED ORDER — CEFAZOLIN SODIUM-DEXTROSE 2-4 GM/100ML-% IV SOLN
2.0000 g | INTRAVENOUS | Status: AC
  Administered 2016-12-18: 2 g via INTRAVENOUS
  Filled 2016-12-18: qty 100

## 2016-12-18 MED ORDER — SUGAMMADEX SODIUM 200 MG/2ML IV SOLN
INTRAVENOUS | Status: DC | PRN
  Administered 2016-12-18: 200 mg via INTRAVENOUS

## 2016-12-18 MED ORDER — KETOROLAC TROMETHAMINE 30 MG/ML IJ SOLN
INTRAMUSCULAR | Status: AC
  Filled 2016-12-18: qty 1

## 2016-12-18 SURGICAL SUPPLY — 30 items
BAG DRAIN URO-CYSTO SKYTR STRL (DRAIN) ×3 IMPLANT
BAG URINE DRAINAGE (UROLOGICAL SUPPLIES) IMPLANT
BAG URINE LEG 19OZ MD ST LTX (BAG) IMPLANT
CATH FOLEY 2WAY SLVR  5CC 20FR (CATHETERS) ×1
CATH FOLEY 2WAY SLVR  5CC 22FR (CATHETERS)
CATH FOLEY 2WAY SLVR 5CC 20FR (CATHETERS) ×2 IMPLANT
CATH FOLEY 2WAY SLVR 5CC 22FR (CATHETERS) IMPLANT
CATH URET 5FR 28IN CONE TIP (BALLOONS)
CATH URET 5FR 28IN OPEN ENDED (CATHETERS) ×3 IMPLANT
CATH URET 5FR 70CM CONE TIP (BALLOONS) IMPLANT
CLOTH BEACON ORANGE TIMEOUT ST (SAFETY) ×3 IMPLANT
ELECT REM PT RETURN 9FT ADLT (ELECTROSURGICAL) ×3
ELECTRODE REM PT RTRN 9FT ADLT (ELECTROSURGICAL) ×2 IMPLANT
GLOVE SURG SS PI 8.0 STRL IVOR (GLOVE) ×3 IMPLANT
GOWN STRL REUS W/ TWL LRG LVL3 (GOWN DISPOSABLE) ×2 IMPLANT
GOWN STRL REUS W/ TWL XL LVL3 (GOWN DISPOSABLE) ×2 IMPLANT
GOWN STRL REUS W/TWL LRG LVL3 (GOWN DISPOSABLE) ×1
GOWN STRL REUS W/TWL XL LVL3 (GOWN DISPOSABLE) ×4 IMPLANT
GUIDEWIRE 0.038 PTFE COATED (WIRE) IMPLANT
GUIDEWIRE ANG ZIPWIRE 038X150 (WIRE) IMPLANT
GUIDEWIRE STR DUAL SENSOR (WIRE) ×3 IMPLANT
HOLDER FOLEY CATH W/STRAP (MISCELLANEOUS) IMPLANT
KIT RM TURNOVER CYSTO AR (KITS) ×3 IMPLANT
LOOP CUT BIPOLAR 24F LRG (ELECTROSURGICAL) ×3 IMPLANT
MANIFOLD NEPTUNE II (INSTRUMENTS) IMPLANT
NS IRRIG 500ML POUR BTL (IV SOLUTION) IMPLANT
PACK CYSTO (CUSTOM PROCEDURE TRAY) ×3 IMPLANT
PLUG CATH AND CAP STER (CATHETERS) IMPLANT
SYRINGE IRR TOOMEY STRL 70CC (SYRINGE) IMPLANT
TUBE CONNECTING 12X1/4 (SUCTIONS) IMPLANT

## 2016-12-18 NOTE — Anesthesia Preprocedure Evaluation (Addendum)
Anesthesia Evaluation  Patient identified by MRN, date of birth, ID band Patient awake    Reviewed: Allergy & Precautions, H&P , Patient's Chart, lab work & pertinent test results  History of Anesthesia Complications (+) DIFFICULT AIRWAY and history of anesthetic complications  Airway Mallampati: III  TM Distance: >3 FB Neck ROM: full  Mouth opening: Limited Mouth Opening  Dental no notable dental hx. (+) Dental Advisory Given   Pulmonary    Pulmonary exam normal breath sounds clear to auscultation       Cardiovascular Exercise Tolerance: Good  Rhythm:regular Rate:Normal     Neuro/Psych    GI/Hepatic   Endo/Other    Renal/GU      Musculoskeletal   Abdominal   Peds  Hematology   Anesthesia Other Findings   Reproductive/Obstetrics                           Anesthesia Physical Anesthesia Plan  ASA: II  Anesthesia Plan: General   Post-op Pain Management:    Induction: Intravenous  PONV Risk Score and Plan: 2 and Treatment may vary due to age or medical condition, Dexamethasone and Ondansetron  Airway Management Planned: Oral ETT  Additional Equipment:   Intra-op Plan:   Post-operative Plan: Extubation in OR  Informed Consent: I have reviewed the patients History and Physical, chart, labs and discussed the procedure including the risks, benefits and alternatives for the proposed anesthesia with the patient or authorized representative who has indicated his/her understanding and acceptance.     Plan Discussed with:   Anesthesia Plan Comments: (  )        Anesthesia Quick Evaluation

## 2016-12-18 NOTE — Anesthesia Postprocedure Evaluation (Signed)
Anesthesia Post Note  Patient: EVERSON MOTT  Procedure(s) Performed: CYSTOSCOPY TRANSURETHRAL RESECTION OF BLADDER TUMOR (TURBT) (N/A Bladder)     Patient location during evaluation: PACU Anesthesia Type: General Level of consciousness: awake and alert Pain management: pain level controlled Vital Signs Assessment: post-procedure vital signs reviewed and stable Respiratory status: spontaneous breathing, nonlabored ventilation, respiratory function stable and patient connected to nasal cannula oxygen Cardiovascular status: blood pressure returned to baseline and stable Postop Assessment: no apparent nausea or vomiting Anesthetic complications: no Comments: Included discussion with Pt and wife on Difficult intubation    Last Vitals:  Vitals:   12/18/16 1200 12/18/16 1245  BP:  131/63  Pulse: 68 71  Resp: (!) 23 16  Temp:  36.9 C  SpO2: 95% 96%    Last Pain:  Vitals:   12/18/16 4784  TempSrc: Oral                 Esty Ahuja EDWARD

## 2016-12-18 NOTE — Interval H&P Note (Signed)
History and Physical Interval Note:  12/18/2016 8:30 AM  Lee Hughes  has presented today for surgery, with the diagnosis of bladder tumor  The various methods of treatment have been discussed with the patient and family. After consideration of risks, benefits and other options for treatment, the patient has consented to  Procedure(s): CYSTOSCOPY TRANSURETHRAL RESECTION OF BLADDER TUMOR (TURBT) POSSIBLE EPIRUBACIN (N/A) CYSTOSCOPY WITH STENT PLACEMENT (Left) as a surgical intervention .  The patient's history has been reviewed, patient examined, no change in status, stable for surgery.  I have reviewed the patient's chart and labs.  Questions were answered to the patient's satisfaction.     [name]

## 2016-12-18 NOTE — Op Note (Signed)
Procedure: 1. Cystoscopy with transurethral resection of 3 cm left lateral wall tumor. 2. Instillation of epirubicin in PACU.  Preop diagnosis: Left lateral wall bladder tumor.  Postop diagnosis: 3 cm left lateral wall bladder tumor.  Surgeon: Dr. [name].  Anesthesia: General.  Specimen: Bladder tumor chips.  Drain: 44 [place] Foley catheter.  EBL: Minimal.  Complications: None.  Indications: Lee Hughes is a 56 year old white male who was found on recent evaluation to have a 1.6 cm lesion on CT scan adjacent to the left ureteral orifice consistent with a urothelial neoplasm.  He presents today for further evaluation and treatment.  Procedure: Lee Hughes was taken to the operating room where he was given antibiotics and a general anesthetic was induced.  He was placed in the lithotomy position and fitted with PAS hose.  His perineum and genitalia were prepped with Betadine solution and he was draped in usual sterile fashion.  Cystoscopy was performed using the 23 [place] scope and 30 and 70 degree lenses.  Inspection revealed a normal urethra.  The external sphincter was intact.  The prostatic urethra was approximately 2 cm in length with bilobar hyperplasia with minimal obstruction.  Examination of the bladder demonstrated mild trabeculation with a papillary bladder tumor approximately 2 cm lateral to the left ureteral orifice.  There was a main tumor component that was approximately 1.6 cm as noted on CT but there were multiple small  satellite lesions extending for a diameter of approximately 3 cm.  The tumors appeared superficial.  After initial inspection the urethra was calibrated to 30 [place] with [employer] sounds and a 26 French continuous-flow resectoscope sheath was inserted.  This was fitted with an Beatrix Fetters handle 30 degree lens and a bipolar loop.  The tumor was resected down to the muscularis in the tumor bed and surrounding mucosa were generously fulgurated.  The initial  specimen was retrieved.  I then attempted to obtain deeper layers however his bladder wall was then and I was unable to get an adequate biopsy of the muscularis below the tumor for fear of bladder wall perforation.  At the end of the resection there was no evidence of perforation and there was no bleeding.  The ureteral orifice remained intact.  A 20 French Foley catheter was inserted and the balloon was filled with 10 cc of sterile fluid.  The bladder was irrigated with clear return and the catheter was placed to straight drainage.  He was taken down from the lithotomy position and his anesthetic was reversed.  He was moved to recovery room in stable conditions.  Once in the PACU his bladder was instilled with 50 mg of epirubicin and 50 mL of diluent.  The epirubicin was left indwelling for 1 hour and the bladder was then drained.  There were no complications.

## 2016-12-18 NOTE — Anesthesia Procedure Notes (Addendum)
Procedure Name: Intubation Date/Time: 12/18/2016 8:47 AM Performed by: Wanita Chamberlain, CRNA Pre-anesthesia Checklist: Patient identified, Emergency Drugs available, Suction available, Patient being monitored and Timeout performed Patient Re-evaluated:Patient Re-evaluated prior to induction Oxygen Delivery Method: Circle system utilized Preoxygenation: Pre-oxygenation with 100% oxygen Induction Type: IV induction and Cricoid Pressure applied Ventilation: Mask ventilation without difficulty Laryngoscope Size: Mac and 3 Tube type: Oral Tube size: 8.0 mm Number of attempts: 2 Airway Equipment and Method: Video-laryngoscopy and Rigid stylet (S4) Placement Confirmation: ETT inserted through vocal cords under direct vision,  positive ETCO2,  CO2 detector and breath sounds checked- equal and bilateral Secured at: 23 cm Tube secured with: Tape Dental Injury: Teeth and Oropharynx as per pre-operative assessment  Difficulty Due To: Difficulty was anticipated and Difficult Airway- due to limited oral opening Future Recommendations: Recommend- induction with short-acting agent, and alternative techniques readily available Comments: Attempted DL with MAC3 blade.  No view.  Second attempt with glidescope.  Direct visualization, difficult insertion of ETT due to limited opening.

## 2016-12-18 NOTE — Transfer of Care (Signed)
  Last Vitals:  Vitals:   12/18/16 0632 12/18/16 0923  BP: (!) 142/80   Pulse: 61 81  Resp: 16   Temp: 36.9 C   SpO2: 98% 98%    Last Pain:  Vitals:   12/18/16 3912  TempSrc: Oral      Patients Stated Pain Goal: 6 (12/18/16 0730)  Immediate Anesthesia Transfer of Care Note  Patient: Lee Hughes  Procedure(s) Performed: Procedure(s) (LRB): CYSTOSCOPY TRANSURETHRAL RESECTION OF BLADDER TUMOR (TURBT) (N/A)  Patient Location: PACU  Anesthesia Type: General  Level of Consciousness: awake, alert  and oriented  Airway & Oxygen Therapy: Patient Spontanous Breathing and Patient connected to nasal cannulaoxygen  Post-op Assessment: Report given to PACU RN and Post -op Vital signs reviewed and stable  Post vital signs: Reviewed and stable  Complications: No apparent anesthesia complications

## 2016-12-18 NOTE — Anesthesia Procedure Notes (Deleted)
Performed by: Lyndle Herrlich, MD

## 2016-12-18 NOTE — Discharge Instructions (Addendum)
December 18, 2016  [place] [address]   Dear Mr. Johansson,   I hope you are doing well after your surgery.  Included with this note is the letter you should have received after your surgery. I think it is always a good idea to let the anesthesia team know about previous difficult intubations.  Thank you for communicating it with [place].  As we discussed in the holding area prior to you surgery, I was able to review the previous records and did not need to read the letter.  We modified our intubation strategy with the information from the previous surgery and were able to intubate you without difficulty.  I mentioned this to you in the recovery room, but you may not remember our discussion. We used a "Glidescope" for the procedure and our technique is documented in our anesthetic record.  This record is available should others need to review it. If you have any questions about the procedure, please let me know and I will be happy to help.    During your recent procedure at [facility], your anesthesia team determined that you were a difficult intubation.  This means that there was difficulty in placing a breathing tube from your mouth into your windpipe.  This procedure is commonly performed for general anesthetics for the purpose of providing oxygen and anesthetic gases during the operation.  Routinely, this is done using an instrument, known as a laryngoscope, to visualize the vocal cords and directly place the breathing tube into the trachea.  It is very important for you to tell your Anesthesiologist when you have future operations, that you are a difficult intubation so that other arrangements, personnel, or instruments can be obtained to ensure your safety.   We recommend that you obtain and wear a Medical Alert Bracelet in case an emergency arises.  There are many companies who will sell you such a bracelet in many styles.  They are easily found on the internet by searching  "Medical Alert Bracelet."  Yours should have the words "Difficult Intubation" written on the bracelet.   It was a pleasure to take care of you for your surgery, and I hope the best for you in the future. Should you have any questions, please feel free to contact me at [phone].   Muranda Coye EDWARD           CYSTOSCOPY HOME CARE INSTRUCTIONS  Activity: Rest for the remainder of the day.  Do not drive or operate equipment today.  You may resume normal activities in one to two days as instructed by your physician.   Meals: Drink plenty of liquids and eat light foods such as gelatin or soup this evening.  You may return to a normal meal plan tomorrow.  Return to Work: You may return to work in one to two days or as instructed by your physician.  Special Instructions / Symptoms: Call your physician if any of these symptoms occur:   -persistent or heavy bleeding  -bleeding which continues after first few urination  -large blood clots that are difficult to pass  -urine stream diminishes or stops completely  -fever equal to or higher than 101 degrees Farenheit.  -cloudy urine with a strong, foul odor  -severe pain  Females should always wipe from front to back after elimination.  You may feel some burning pain when you urinate.  This should disappear with time.  Applying moist heat to the lower abdomen or a  hot tub bath may help relieve the pain. \  Don't drive for 48 hours. No lifting over 10lb for a week.  You may remove the catheter in the morning and if you don't think you can do that, you may come to the office to have it removed.     Post Anesthesia Home Care Instructions  Activity: Get plenty of rest for the remainder of the day. A responsible individual must stay with you for 24 hours following the procedure.  For the next 24 hours, DO NOT: -Drive a car -Paediatric nurse -Drink alcoholic beverages -Take any medication unless instructed by your  physician -Make any legal decisions or sign important papers.  Meals: Start with liquid foods such as gelatin or soup. Progress to regular foods as tolerated. Avoid greasy, spicy, heavy foods. If nausea and/or vomiting occur, drink only clear liquids until the nausea and/or vomiting subsides. Call your physician if vomiting continues.  Special Instructions/Symptoms: Your throat may feel dry or sore from the anesthesia or the breathing tube placed in your throat during surgery. If this causes discomfort, gargle with warm salt water. The discomfort should disappear within 24 hours.  If you had a scopolamine patch placed behind your ear for the management of post- operative nausea and/or vomiting:  1. The medication in the patch is effective for 72 hours, after which it should be removed.  Wrap patch in a tissue and discard in the trash. Wash hands thoroughly with soap and water. 2. You may remove the patch earlier than 72 hours if you experience unpleasant side effects which may include dry mouth, dizziness or visual disturbances. 3. Avoid touching the patch. Wash your hands with soap and water after contact with the patch.

## 2016-12-19 ENCOUNTER — Ambulatory Visit (INDEPENDENT_AMBULATORY_CARE_PROVIDER_SITE_OTHER): Payer: 59 | Admitting: Urology

## 2016-12-19 ENCOUNTER — Encounter (HOSPITAL_BASED_OUTPATIENT_CLINIC_OR_DEPARTMENT_OTHER): Payer: Self-pay | Admitting: Urology

## 2016-12-19 DIAGNOSIS — D494 Neoplasm of unspecified behavior of bladder: Secondary | ICD-10-CM | POA: Diagnosis not present

## 2016-12-19 DIAGNOSIS — N200 Calculus of kidney: Secondary | ICD-10-CM | POA: Diagnosis not present

## 2016-12-19 DIAGNOSIS — R310 Gross hematuria: Secondary | ICD-10-CM

## 2016-12-19 MED FILL — ROSUVASTATIN CALCIUM 20 MG: 20 | 90 days supply | Qty: 90 | Fill #0

## 2016-12-28 ENCOUNTER — Ambulatory Visit (INDEPENDENT_AMBULATORY_CARE_PROVIDER_SITE_OTHER): Payer: 59 | Admitting: Urology

## 2016-12-28 ENCOUNTER — Ambulatory Visit: Payer: 59 | Admitting: Urology

## 2016-12-28 DIAGNOSIS — C672 Malignant neoplasm of lateral wall of bladder: Secondary | ICD-10-CM | POA: Diagnosis not present

## 2017-01-11 ENCOUNTER — Ambulatory Visit (INDEPENDENT_AMBULATORY_CARE_PROVIDER_SITE_OTHER): Payer: 59 | Admitting: Urology

## 2017-01-11 DIAGNOSIS — C672 Malignant neoplasm of lateral wall of bladder: Secondary | ICD-10-CM | POA: Diagnosis not present

## 2017-01-14 MED FILL — FENOFIBRATE 160 MG TABLET: 160 | 90 days supply | Qty: 90 | Fill #1

## 2017-01-14 MED FILL — HYOSCYAMINE ER 0.375 MG TAB: 0.375 | 90 days supply | Qty: 90 | Fill #1

## 2017-01-14 MED FILL — OMEGA-3 ETHYL ESTERS 1 GM C: 1 | 90 days supply | Qty: 180 | Fill #1

## 2017-01-14 MED FILL — ROSUVASTATIN CALCIUM 5 MG T: 5 | 90 days supply | Qty: 90 | Fill #1

## 2017-01-18 ENCOUNTER — Other Ambulatory Visit (HOSPITAL_COMMUNITY)
Admission: RE | Admit: 2017-01-18 | Discharge: 2017-01-18 | Disposition: A | Discharge status: Home / Self Care | Payer: 59 | Source: Other Acute Inpatient Hospital | Attending: Urology | Admitting: Urology

## 2017-01-18 ENCOUNTER — Ambulatory Visit (INDEPENDENT_AMBULATORY_CARE_PROVIDER_SITE_OTHER): Payer: 59 | Admitting: Urology

## 2017-01-18 DIAGNOSIS — C672 Malignant neoplasm of lateral wall of bladder: Secondary | ICD-10-CM

## 2017-01-18 DIAGNOSIS — D494 Neoplasm of unspecified behavior of bladder: Secondary | ICD-10-CM | POA: Insufficient documentation

## 2017-01-19 LAB — URINE CULTURE: CULTURE: NO GROWTH

## 2017-01-25 ENCOUNTER — Ambulatory Visit (INDEPENDENT_AMBULATORY_CARE_PROVIDER_SITE_OTHER): Payer: 59 | Admitting: Urology

## 2017-01-25 DIAGNOSIS — C672 Malignant neoplasm of lateral wall of bladder: Secondary | ICD-10-CM | POA: Diagnosis not present

## 2017-02-01 ENCOUNTER — Ambulatory Visit (INDEPENDENT_AMBULATORY_CARE_PROVIDER_SITE_OTHER): Payer: 59 | Admitting: Urology

## 2017-02-01 ENCOUNTER — Ambulatory Visit: Payer: 59 | Admitting: Urology

## 2017-02-01 DIAGNOSIS — C672 Malignant neoplasm of lateral wall of bladder: Secondary | ICD-10-CM | POA: Diagnosis not present

## 2017-02-15 ENCOUNTER — Ambulatory Visit (INDEPENDENT_AMBULATORY_CARE_PROVIDER_SITE_OTHER): Payer: 59 | Admitting: Urology

## 2017-02-15 ENCOUNTER — Ambulatory Visit: Payer: 59 | Admitting: Allergy

## 2017-02-15 ENCOUNTER — Encounter: Payer: Self-pay | Admitting: Allergy

## 2017-02-15 VITALS — BP 122/70 | HR 72 | Resp 18

## 2017-02-15 DIAGNOSIS — L508 Other urticaria: Secondary | ICD-10-CM

## 2017-02-15 DIAGNOSIS — J3081 Allergic rhinitis due to animal (cat) (dog) hair and dander: Secondary | ICD-10-CM | POA: Diagnosis not present

## 2017-02-15 DIAGNOSIS — C672 Malignant neoplasm of lateral wall of bladder: Secondary | ICD-10-CM | POA: Diagnosis not present

## 2017-02-15 DIAGNOSIS — L309 Dermatitis, unspecified: Secondary | ICD-10-CM

## 2017-02-15 DIAGNOSIS — T783XXD Angioneurotic edema, subsequent encounter: Secondary | ICD-10-CM | POA: Diagnosis not present

## 2017-02-15 MED ORDER — TRIAMCINOLONE ACETONIDE 0.5 % EX OINT: 1.0000 "application " | TOPICAL_OINTMENT | Freq: Two times a day (BID) | CUTANEOUS | 5 refills | Status: DC

## 2017-02-15 NOTE — Patient Instructions (Addendum)
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  Will obtain serum IgE levels to follow trends and see if levels are dropping.   Your environmental allergen panel was positive to Cat, Dog, tree and grass pollens.  Avoidance measures provided.    Rash   - etiology of rash is unknown however it does have appearance of possible contact dermatitis which could be triggered by pet dander exposure or other allergen exposures    - recommend topical use of triamcinolone which can be applied in thin layer up to twice a day    - keep skin well moisturized    - may use Zyrtec 10mg  twice a day   Nasal congestion    - on exam there is a degree of nasal congestion likely due to daily allergen exposures    - may use Nasacort or any OTC nasal spray like Flonase, Nasacort or Rhinocort.  1-2 sprays each nostril daily.  Recommend use for 1-2 weeks at a time before stopping.    Follow-up 3-4 months

## 2017-02-15 NOTE — Progress Notes (Signed)
Follow-up Note  RE: Lee Hughes MRN: [id number] DOB: January 15, 1960 Date of Office Visit: 02/15/2017   History of present illness: Lee Hughes is a 57 y.o. male presenting today for follow-up of chronic urticaria.  He was last seen in the office on 03/19/16 by myself for initial evaluation.  He presents today with his wife.    His biggest concern today is a rash he has.  He states several months ago this rash started on his arms that is itchy with raised, red bumps.  This rash looks different from the hive rash he has previously.  He then was diagnosed with bladder cancer after have hematuria.  He was started on BCG intravesical therapy and the rash he has on his arms seemed to get worse.  He also has the rash around his trunk/waist.  He states the rash "won't heal".  It is itchy thus he scratches and the scabs.  No similar rash in household members.  He wants to know if the rash has anything to do with the cancer treatment or his food allergy.  He did talk with his oncologist about the rash who recommended he can take benadryl/zyrtec and to talk to his allergist.  He has been using wife's Corticure (?sp) from [employer] on the rash with some relief.  They are not sure if this is a steroid cream or not.  He has been taking zyrtec daily.  He denies any fevers or any systemic symptoms at this time.    After last visit I did order labs to see if an underlying cause of hives and swelling could be determined.  He was found to have IgE to beef, pork and lamb but negative alpha gal IgE.  He has been avoiding red meats and does have access to an epipen.  He denies any accidental ingestions.  He also was found to have IgE at high levels to cat and dog and moderate to low levels to tree pollens and grass.   He states he was not aware of the environmental allergens.  He does have a cat in the home that does go in the bedroom.  He also has daughter with dogs who they visit.  He has noted that around dogs he has  been itchy and is also itchy in his home with cat exposure.    Review of systems: Review of Systems  Constitutional: Negative for chills, fever and malaise/fatigue.  HENT: Positive for congestion. Negative for ear discharge, ear pain, nosebleeds, sinus pain and sore throat.   Eyes: Negative for pain, discharge and redness.  Respiratory: Negative for cough, shortness of breath and wheezing.   Cardiovascular: Negative for chest pain.  Gastrointestinal: Negative for abdominal pain, constipation, diarrhea, heartburn, nausea and vomiting.  Genitourinary: Positive for flank pain and hematuria.  Musculoskeletal: Negative for joint pain.  Skin: Positive for itching and rash.  Neurological: Negative for headaches.    All other systems negative unless noted above in HPI  Past medical/social/surgical/family history have been reviewed and are unchanged unless specifically indicated below.  No changes  Medication List: Allergies as of 02/15/2017      Reactions   Other    No beef, pork, deer, products secondary from tick bite      Medication List        Accurate as of 02/15/17  4:37 PM. Always use your most recent med list.          aspirin 81 MG chewable tablet Chew  81 mg by mouth daily.   BCG vaccine 81 mg in sodium chloride 0.9 % 50 mL Instill 81 mg into the bladder once a week.   fenofibrate 160 MG tablet 160 mg every morning.   LEVBID 0.375 MG 12 hr tablet Generic drug:  hyoscyamine Take 0.375 mg by mouth daily.   multivitamin capsule Take by mouth daily.   niacin 1000 MG CR tablet Commonly known as:  NIASPAN Take 3,000 mg by mouth.   omega-3 acid ethyl esters 1 g capsule Commonly known as:  LOVAZA Take 2 g by mouth daily.   rosuvastatin 5 MG tablet Commonly known as:  CRESTOR Take 20 mg by mouth daily.   ZYRTEC ALLERGY 10 MG Caps Generic drug:  Cetirizine HCl Take by mouth daily.       Known medication allergies: Allergies  Allergen Reactions  . Other      No beef, pork, deer, products secondary from tick bite     Physical examination: Blood pressure 122/70, pulse 72, resp. rate 18, SpO2 97 %.  General: Alert, interactive, in no acute distress. HEENT: PERRLA, TMs pearly gray, turbinates moderately edematous without discharge, post-pharynx non erythematous. Neck: Supple without lymphadenopathy. Lungs: Clear to auscultation without wheezing, rhonchi or rales. {no increased work of breathing. CV: Normal S1, S2 without murmurs. Abdomen: Nondistended, nontender. Skin: scattered erythematous papules some excoriated with scabbing over arms, waist, jawline. Extremities:  No clubbing, cyanosis or edema. Neuro:   Grossly intact.  Diagnositics/Labs: Labs:  Component     Latest Ref Rng & Units 03/19/2016  D Pteronyssinus IgE     Class 0 kU/L <0.10  D Farinae IgE     Class 0 kU/L <0.10  Cat Dander IgE     Class V kU/L 34.20 (A)  Dog Dander IgE     Class IV kU/L 13.80 (A)  [place] Grass IgE     Class II kU/L 0.63 (A)  [place] Bluegrass IgE     Class III kU/L 1.47 (A)  [place] Grass IgE     Class I kU/L 0.49 (A)  [place] Grass IgE     Class II kU/L 0.60 (A)  Cockroach, American IgE     Class 0 kU/L <0.10  Penicillium Chrysogen IgE     Class 0 kU/L <0.10  Cladosporium Herbarum IgE     Class 0 kU/L <0.10  Aspergillus Fumigatus IgE     Class 0 kU/L <0.10  Mucor Racemosus IgE     Class 0 kU/L <0.10  Alternaria Alternata IgE     Class 0 kU/L <0.10  Stemphylium Herbarum IgE     Class 0 kU/L <0.10  Common Silver Wendee Copp IgE     Class I kU/L 0.53 (A)  Oak, White IgE     Class 0/I kU/L 0.16 (A)  Elm, American IgE     Class 0 kU/L <0.10  Maple/Box Elder IgE     Class 0 kU/L <0.10  Hickory, White IgE     Class 0 kU/L <0.10  White Mulberry IgE     Class 0 kU/L <0.10  Cedar, [place] IgE     Class III kU/L 1.85 (A)  Ragweed, Short IgE     Class 0 kU/L <0.10  Plantain, English IgE     Class 0 kU/L <0.10  Pigweed, Rough IgE      Class 0 kU/L <0.10  Nettle IgE     Class 0 kU/L <0.10   Component     Latest Ref Rng &  Units 03/19/2016  cu index     <10 7.9   Component     Latest Ref Rng & Units 03/19/2016  Beef (Bos spp) IgE     <0.35 kU/L 0.59 (H)  Class Interpretation      1  Lamb/Mutton (Ovis spp) IgE     <0.35 kU/L 7.91 (H)  Class Interpretation      3  Pork (Sus spp) IgE     <0.35 kU/L 8.81 (H)  Class Interpretation      3  Alpha Gal IgE*     <0.35 kU/L <0.10   Component     Latest Ref Rng & Units 03/19/2016  Tryptase     2.2 - 13.2 ug/L 6.8   Component     Latest Ref Rng & Units 03/19/2016  WBC     3.4 - 10.8 x10E3/uL 5.4  RBC     4.14 - 5.80 x10E6/uL 5.39  Hemoglobin     13.0 - 17.7 g/dL 13.9  HCT     37.5 - 51.0 % 43.1  MCV     79 - 97 fL 80  MCH     26.6 - 33.0 pg 25.8 (L)  MCHC     31.5 - 35.7 g/dL 32.3  RDW     12.3 - 15.4 % 15.2  Platelets     150 - 379 x10E3/uL 297  Neutrophils     Not Estab. % 53  Lymphs     Not Estab. % 31  Monocytes     Not Estab. % 9  Eos     Not Estab. % 6  Basos     Not Estab. % 1  NEUT#     1.4 - 7.0 x10E3/uL 2.9  Lymphocyte #     0.7 - 3.1 x10E3/uL 1.6  Monocytes Absolute     0.1 - 0.9 x10E3/uL 0.5  EOS (ABSOLUTE)     0.0 - 0.4 x10E3/uL 0.3  Basophils Absolute     0.0 - 0.2 x10E3/uL 0.0  Immature Granulocytes     Not Estab. % 0  Immature Grans (Abs)     0.0 - 0.1 x10E3/uL 0.0  Glucose     65 - 99 mg/dL 85  BUN     6 - 24 mg/dL 9  Creatinine     0.76 - 1.27 mg/dL 1.27  GFR, Est Non African American     >59 mL/min/1.73 64  GFR, Est African American     >59 mL/min/1.73 74  BUN/Creatinine Ratio     9 - 20 7 (L)  Sodium     134 - 144 mmol/L 143  Potassium     3.5 - 5.2 mmol/L 4.3  Chloride     96 - 106 mmol/L 102  CO2     18 - 29 mmol/L 23  Calcium     8.7 - 10.2 mg/dL 9.9  Total Protein     6.0 - 8.5 g/dL 7.0  Albumin     3.5 - 5.5 g/dL 4.5  Globulin, Total     1.5 - 4.5 g/dL 2.5  Albumin/Globulin Ratio     1.2 -  2.2 1.8  Total Bilirubin     0.0 - 1.2 mg/dL 0.3  Alkaline Phosphatase     39 - 117 IU/L 50  AST     0 - 40 IU/L 28  ALT     0 - 44 IU/L 24   Assessment and plan: Urticaria with angioedema     -  this has improved however he has a new pruritic rash (see below)     - labs obtained after initial visit showed 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  He should continue avoidance of red meats at this time.  Will obtain serum IgE levels to follow trends and see if levels are dropping and if so would recommend in-office challenge to determine if they can be reintroduced into the diet.      - environmental allergen panel was positive to Cat, Dog, tree and grass pollens.  Avoidance measures provided.    Dermatitis, inflammatory   - etiology of rash is unknown however it does have appearance of possible contact dermatitis which could be triggered by pet dander exposure or other allergen exposures.    - I do not feel that the dermatitis was caused by his BCG intravesical therapy as he had the rash prior to onset of the therapy and therapy is localized.  It is possible that with a reduced immune system due to cancer treatment that this places him at risk for poorer wound healing as he does have a lot of excoriations.   He has not endorsed any systemic symptoms to suggest an IgE mediated reaction to the BCG therapy thus will treat the rash.       - recommend topical use of triamcinolone which can be applied in thin layer up to twice a day    - keep skin well moisturized    - may use Zyrtec 10mg  twice a day   Nasal congestion/allergic rhinitis    - on exam there is a degree of nasal congestion likely due to daily allergen exposures    - may use Nasacort or any OTC nasal spray like Flonase, Nasacort or Rhinocort.  1-2 sprays each nostril daily.  Recommend use for 1-2 weeks at a time before stopping.         - antihistamine as above  Follow-up 3-4 months  I appreciate the opportunity to take part in Derrell's care. Please do not hesitate to contact me with questions.  Sincerely,   Prudy Feeler, MD Allergy/Immunology [facility]

## 2017-02-18 DIAGNOSIS — Z1389 Encounter for screening for other disorder: Secondary | ICD-10-CM | POA: Diagnosis not present

## 2017-02-18 DIAGNOSIS — A4902 Methicillin resistant Staphylococcus aureus infection, unspecified site: Secondary | ICD-10-CM | POA: Diagnosis not present

## 2017-02-18 DIAGNOSIS — E663 Overweight: Secondary | ICD-10-CM | POA: Diagnosis not present

## 2017-02-18 DIAGNOSIS — Z6828 Body mass index (BMI) 28.0-28.9, adult: Secondary | ICD-10-CM | POA: Diagnosis not present

## 2017-02-22 ENCOUNTER — Ambulatory Visit (INDEPENDENT_AMBULATORY_CARE_PROVIDER_SITE_OTHER): Payer: 59 | Admitting: Urology

## 2017-02-22 DIAGNOSIS — C672 Malignant neoplasm of lateral wall of bladder: Secondary | ICD-10-CM | POA: Diagnosis not present

## 2017-03-29 ENCOUNTER — Ambulatory Visit (INDEPENDENT_AMBULATORY_CARE_PROVIDER_SITE_OTHER): Payer: 59 | Admitting: Urology

## 2017-03-29 DIAGNOSIS — N401 Enlarged prostate with lower urinary tract symptoms: Secondary | ICD-10-CM

## 2017-03-29 DIAGNOSIS — Z8551 Personal history of malignant neoplasm of bladder: Secondary | ICD-10-CM | POA: Diagnosis not present

## 2017-03-29 DIAGNOSIS — R3912 Poor urinary stream: Secondary | ICD-10-CM

## 2017-04-12 ENCOUNTER — Ambulatory Visit: Payer: 59 | Admitting: Urology

## 2017-04-15 MED FILL — OMEGA-3 ETHYL ESTERS 1 GM C: 1 | 90 days supply | Qty: 180 | Fill #0

## 2017-04-15 MED FILL — ROSUVASTATIN CALCIUM 5 MG T: 5 | 90 days supply | Qty: 90 | Fill #0

## 2017-04-15 MED FILL — FENOFIBRATE 160 MG TABLET: 160 | 90 days supply | Qty: 90 | Fill #0

## 2017-04-15 MED FILL — HYOSCYAMINE ER 0.375 MG TAB: 0.375 | 90 days supply | Qty: 90 | Fill #2

## 2017-04-30 ENCOUNTER — Other Ambulatory Visit: Payer: Self-pay | Admitting: General Surgery

## 2017-04-30 DIAGNOSIS — R5383 Other fatigue: Secondary | ICD-10-CM | POA: Diagnosis not present

## 2017-04-30 DIAGNOSIS — T783XXD Angioneurotic edema, subsequent encounter: Secondary | ICD-10-CM | POA: Diagnosis not present

## 2017-04-30 DIAGNOSIS — L508 Other urticaria: Secondary | ICD-10-CM | POA: Diagnosis not present

## 2017-04-30 DIAGNOSIS — E785 Hyperlipidemia, unspecified: Secondary | ICD-10-CM | POA: Diagnosis not present

## 2017-05-01 LAB — CBC WITH DIFFERENTIAL/PLATELET
BASOS ABS: 0 10*3/uL (ref 0.0–0.2)
Basos: 0 %
EOS (ABSOLUTE): 0.4 10*3/uL (ref 0.0–0.4)
Eos: 8 %
HEMOGLOBIN: 14.5 g/dL (ref 13.0–17.7)
Hematocrit: 45.1 % (ref 37.5–51.0)
IMMATURE GRANS (ABS): 0 10*3/uL (ref 0.0–0.1)
IMMATURE GRANULOCYTES: 0 %
LYMPHS: 32 %
Lymphocytes Absolute: 1.5 10*3/uL (ref 0.7–3.1)
MCH: 25.1 pg — ABNORMAL LOW (ref 26.6–33.0)
MCHC: 32.2 g/dL (ref 31.5–35.7)
MCV: 78 fL — ABNORMAL LOW (ref 79–97)
MONOCYTES: 9 %
Monocytes Absolute: 0.4 10*3/uL (ref 0.1–0.9)
NEUTROS ABS: 2.5 10*3/uL (ref 1.4–7.0)
Neutrophils: 51 %
Platelets: 285 10*3/uL (ref 150–379)
RBC: 5.77 x10E6/uL (ref 4.14–5.80)
RDW: 14.7 % (ref 12.3–15.4)
WBC: 4.8 10*3/uL (ref 3.4–10.8)

## 2017-05-01 LAB — COMPREHENSIVE METABOLIC PANEL
ALBUMIN: 4.3 g/dL (ref 3.5–5.5)
ALT: 32 IU/L (ref 0–44)
AST: 31 IU/L (ref 0–40)
Albumin/Globulin Ratio: 1.5 (ref 1.2–2.2)
Alkaline Phosphatase: 79 IU/L (ref 39–117)
BUN / CREAT RATIO: 13 (ref 9–20)
BUN: 15 mg/dL (ref 6–24)
Bilirubin Total: 0.2 mg/dL (ref 0.0–1.2)
CALCIUM: 9.7 mg/dL (ref 8.7–10.2)
CO2: 25 mmol/L (ref 20–29)
Chloride: 103 mmol/L (ref 96–106)
Creatinine, Ser: 1.15 mg/dL (ref 0.76–1.27)
GFR calc non Af Amer: 72 mL/min/{1.73_m2} (ref >59)
GFR, EST AFRICAN AMERICAN: 83 mL/min/{1.73_m2} (ref >59)
GLUCOSE: 88 mg/dL (ref 65–99)
Globulin, Total: 2.8 g/dL (ref 1.5–4.5)
Potassium: 4.8 mmol/L (ref 3.5–5.2)
Sodium: 141 mmol/L (ref 134–144)
TOTAL PROTEIN: 7.1 g/dL (ref 6.0–8.5)

## 2017-05-01 LAB — LIPID PANEL WITH LDL/HDL RATIO
Cholesterol, Total: 166 mg/dL (ref 100–199)
HDL: 39 mg/dL — AB (ref >39)
LDL CALC: 91 mg/dL (ref 0–99)
LDl/HDL Ratio: 2.3 ratio (ref 0.0–3.6)
Triglycerides: 182 mg/dL — ABNORMAL HIGH (ref 0–149)
VLDL CHOLESTEROL CAL: 36 mg/dL (ref 5–40)

## 2017-05-03 ENCOUNTER — Ambulatory Visit (INDEPENDENT_AMBULATORY_CARE_PROVIDER_SITE_OTHER): Payer: 59 | Admitting: Urology

## 2017-05-03 DIAGNOSIS — R3912 Poor urinary stream: Secondary | ICD-10-CM

## 2017-05-03 DIAGNOSIS — N401 Enlarged prostate with lower urinary tract symptoms: Secondary | ICD-10-CM | POA: Diagnosis not present

## 2017-05-03 DIAGNOSIS — Z8551 Personal history of malignant neoplasm of bladder: Secondary | ICD-10-CM

## 2017-05-08 LAB — ALLERGEN, PORK, F26: Pork IgE: 9.71 kU/L — AB

## 2017-05-08 LAB — ALLERGEN BEEF: BEEF IGE: 0.98 kU/L — AB

## 2017-05-08 LAB — ALLERGEN, MUTTON, F88: Allergen Lamb IgE: 5.55 kU/L — AB

## 2017-05-17 ENCOUNTER — Ambulatory Visit: Payer: Self-pay | Admitting: Allergy

## 2017-05-22 MED FILL — ROSUVASTATIN CALCIUM 20 MG: 20 | 90 days supply | Qty: 90 | Fill #1

## 2017-05-29 MED FILL — TAMSULOSIN HCL 0.4 MG CAP: 0.4 | 30 days supply | Qty: 30 | Fill #0

## 2017-06-17 DIAGNOSIS — E7801 Familial hypercholesterolemia: Secondary | ICD-10-CM | POA: Diagnosis not present

## 2017-06-26 MED FILL — TAMSULOSIN HCL 0.4 MG CAP: 0.4 | 30 days supply | Qty: 30 | Fill #1

## 2017-07-18 DIAGNOSIS — M531 Cervicobrachial syndrome: Secondary | ICD-10-CM | POA: Diagnosis not present

## 2017-07-18 DIAGNOSIS — S134XXA Sprain of ligaments of cervical spine, initial encounter: Secondary | ICD-10-CM | POA: Diagnosis not present

## 2017-07-18 DIAGNOSIS — M546 Pain in thoracic spine: Secondary | ICD-10-CM | POA: Diagnosis not present

## 2017-07-19 ENCOUNTER — Other Ambulatory Visit (HOSPITAL_COMMUNITY)
Admission: RE | Admit: 2017-07-19 | Discharge: 2017-07-19 | Disposition: A | Discharge status: Home / Self Care | Payer: 59 | Source: Ambulatory Visit | Attending: Urology | Admitting: Urology

## 2017-07-19 ENCOUNTER — Ambulatory Visit (INDEPENDENT_AMBULATORY_CARE_PROVIDER_SITE_OTHER): Payer: 59 | Admitting: Urology

## 2017-07-19 DIAGNOSIS — N401 Enlarged prostate with lower urinary tract symptoms: Secondary | ICD-10-CM | POA: Diagnosis not present

## 2017-07-19 DIAGNOSIS — Z551 Schooling unavailable and unattainable: Secondary | ICD-10-CM | POA: Diagnosis not present

## 2017-07-19 DIAGNOSIS — R3912 Poor urinary stream: Secondary | ICD-10-CM | POA: Diagnosis not present

## 2017-07-19 DIAGNOSIS — Z8551 Personal history of malignant neoplasm of bladder: Secondary | ICD-10-CM | POA: Insufficient documentation

## 2017-07-19 DIAGNOSIS — R828 Abnormal findings on cytological and histological examination of urine: Secondary | ICD-10-CM | POA: Diagnosis not present

## 2017-07-22 DIAGNOSIS — M546 Pain in thoracic spine: Secondary | ICD-10-CM | POA: Diagnosis not present

## 2017-07-22 DIAGNOSIS — S134XXA Sprain of ligaments of cervical spine, initial encounter: Secondary | ICD-10-CM | POA: Diagnosis not present

## 2017-07-22 DIAGNOSIS — M531 Cervicobrachial syndrome: Secondary | ICD-10-CM | POA: Diagnosis not present

## 2017-07-25 DIAGNOSIS — S134XXA Sprain of ligaments of cervical spine, initial encounter: Secondary | ICD-10-CM | POA: Diagnosis not present

## 2017-07-25 DIAGNOSIS — M531 Cervicobrachial syndrome: Secondary | ICD-10-CM | POA: Diagnosis not present

## 2017-07-25 DIAGNOSIS — M546 Pain in thoracic spine: Secondary | ICD-10-CM | POA: Diagnosis not present

## 2017-07-26 ENCOUNTER — Ambulatory Visit (INDEPENDENT_AMBULATORY_CARE_PROVIDER_SITE_OTHER): Payer: 59 | Admitting: Urology

## 2017-07-26 DIAGNOSIS — N401 Enlarged prostate with lower urinary tract symptoms: Secondary | ICD-10-CM | POA: Diagnosis not present

## 2017-07-26 DIAGNOSIS — Z8551 Personal history of malignant neoplasm of bladder: Secondary | ICD-10-CM | POA: Diagnosis not present

## 2017-07-26 DIAGNOSIS — R3912 Poor urinary stream: Secondary | ICD-10-CM | POA: Diagnosis not present

## 2017-07-26 MED FILL — TAMSULOSIN HCL 0.4 MG CAP: 0.4 | 30 days supply | Qty: 60 | Fill #0

## 2017-07-29 DIAGNOSIS — S134XXA Sprain of ligaments of cervical spine, initial encounter: Secondary | ICD-10-CM | POA: Diagnosis not present

## 2017-07-29 DIAGNOSIS — M531 Cervicobrachial syndrome: Secondary | ICD-10-CM | POA: Diagnosis not present

## 2017-07-29 DIAGNOSIS — M546 Pain in thoracic spine: Secondary | ICD-10-CM | POA: Diagnosis not present

## 2017-08-01 DIAGNOSIS — M546 Pain in thoracic spine: Secondary | ICD-10-CM | POA: Diagnosis not present

## 2017-08-01 DIAGNOSIS — M531 Cervicobrachial syndrome: Secondary | ICD-10-CM | POA: Diagnosis not present

## 2017-08-01 DIAGNOSIS — S134XXA Sprain of ligaments of cervical spine, initial encounter: Secondary | ICD-10-CM | POA: Diagnosis not present

## 2017-08-06 DIAGNOSIS — M531 Cervicobrachial syndrome: Secondary | ICD-10-CM | POA: Diagnosis not present

## 2017-08-06 DIAGNOSIS — M546 Pain in thoracic spine: Secondary | ICD-10-CM | POA: Diagnosis not present

## 2017-08-06 DIAGNOSIS — S134XXA Sprain of ligaments of cervical spine, initial encounter: Secondary | ICD-10-CM | POA: Diagnosis not present

## 2017-08-08 DIAGNOSIS — S134XXA Sprain of ligaments of cervical spine, initial encounter: Secondary | ICD-10-CM | POA: Diagnosis not present

## 2017-08-08 DIAGNOSIS — M546 Pain in thoracic spine: Secondary | ICD-10-CM | POA: Diagnosis not present

## 2017-08-08 DIAGNOSIS — M531 Cervicobrachial syndrome: Secondary | ICD-10-CM | POA: Diagnosis not present

## 2017-08-09 ENCOUNTER — Ambulatory Visit: Payer: 59 | Admitting: Urology

## 2017-08-16 ENCOUNTER — Ambulatory Visit: Payer: 59 | Admitting: Urology

## 2017-08-20 DIAGNOSIS — M531 Cervicobrachial syndrome: Secondary | ICD-10-CM | POA: Diagnosis not present

## 2017-08-20 DIAGNOSIS — S134XXA Sprain of ligaments of cervical spine, initial encounter: Secondary | ICD-10-CM | POA: Diagnosis not present

## 2017-08-20 DIAGNOSIS — M546 Pain in thoracic spine: Secondary | ICD-10-CM | POA: Diagnosis not present

## 2017-08-27 DIAGNOSIS — M546 Pain in thoracic spine: Secondary | ICD-10-CM | POA: Diagnosis not present

## 2017-08-27 DIAGNOSIS — S134XXA Sprain of ligaments of cervical spine, initial encounter: Secondary | ICD-10-CM | POA: Diagnosis not present

## 2017-08-27 DIAGNOSIS — M531 Cervicobrachial syndrome: Secondary | ICD-10-CM | POA: Diagnosis not present

## 2017-08-27 MED FILL — ROSUVASTATIN CALCIUM 20 MG: 20 | 90 days supply | Qty: 90 | Fill #2

## 2017-08-27 MED FILL — OMEGA-3 ETHYL ESTER 1 GM CA: 1 | 30 days supply | Qty: 120 | Fill #0

## 2017-08-27 MED FILL — FENOFIBRATE 160 MG TABLET: 160 | 90 days supply | Qty: 90 | Fill #0

## 2017-09-11 DIAGNOSIS — M546 Pain in thoracic spine: Secondary | ICD-10-CM | POA: Diagnosis not present

## 2017-09-11 DIAGNOSIS — S134XXA Sprain of ligaments of cervical spine, initial encounter: Secondary | ICD-10-CM | POA: Diagnosis not present

## 2017-09-11 DIAGNOSIS — M531 Cervicobrachial syndrome: Secondary | ICD-10-CM | POA: Diagnosis not present

## 2017-09-16 DIAGNOSIS — M546 Pain in thoracic spine: Secondary | ICD-10-CM | POA: Diagnosis not present

## 2017-09-16 DIAGNOSIS — M531 Cervicobrachial syndrome: Secondary | ICD-10-CM | POA: Diagnosis not present

## 2017-09-16 DIAGNOSIS — S134XXA Sprain of ligaments of cervical spine, initial encounter: Secondary | ICD-10-CM | POA: Diagnosis not present

## 2017-09-18 DIAGNOSIS — M531 Cervicobrachial syndrome: Secondary | ICD-10-CM | POA: Diagnosis not present

## 2017-09-18 DIAGNOSIS — S134XXA Sprain of ligaments of cervical spine, initial encounter: Secondary | ICD-10-CM | POA: Diagnosis not present

## 2017-09-18 DIAGNOSIS — M546 Pain in thoracic spine: Secondary | ICD-10-CM | POA: Diagnosis not present

## 2017-09-30 MED FILL — TAMSULOSIN HCL 0.4 MG CAP: 0.4 | 30 days supply | Qty: 60 | Fill #1

## 2017-11-15 ENCOUNTER — Other Ambulatory Visit (HOSPITAL_COMMUNITY)
Admission: RE | Admit: 2017-11-15 | Discharge: 2017-11-15 | Disposition: A | Discharge status: Home / Self Care | Payer: 59 | Source: Ambulatory Visit | Attending: Urology | Admitting: Urology

## 2017-11-15 ENCOUNTER — Ambulatory Visit (INDEPENDENT_AMBULATORY_CARE_PROVIDER_SITE_OTHER): Payer: 59 | Admitting: Urology

## 2017-11-15 DIAGNOSIS — R3912 Poor urinary stream: Secondary | ICD-10-CM | POA: Diagnosis not present

## 2017-11-15 DIAGNOSIS — Z8551 Personal history of malignant neoplasm of bladder: Secondary | ICD-10-CM | POA: Insufficient documentation

## 2017-11-15 DIAGNOSIS — N401 Enlarged prostate with lower urinary tract symptoms: Secondary | ICD-10-CM | POA: Diagnosis not present

## 2017-11-15 DIAGNOSIS — N5201 Erectile dysfunction due to arterial insufficiency: Secondary | ICD-10-CM | POA: Diagnosis not present

## 2017-11-18 DIAGNOSIS — Z8551 Personal history of malignant neoplasm of bladder: Secondary | ICD-10-CM | POA: Diagnosis not present

## 2017-11-21 MED FILL — OMEGA-3 ETHYL ESTER 1 GM CA: 1 | 30 days supply | Qty: 120 | Fill #1

## 2017-12-02 MED FILL — ROSUVASTATIN CALCIUM 20 MG: 20 | 90 days supply | Qty: 90 | Fill #3

## 2017-12-02 MED FILL — TAMSULOSIN HCL 0.4 MG CAP: 0.4 | 30 days supply | Qty: 60 | Fill #2

## 2017-12-13 DIAGNOSIS — R42 Dizziness and giddiness: Secondary | ICD-10-CM | POA: Diagnosis not present

## 2017-12-24 DIAGNOSIS — Z0000 Encounter for general adult medical examination without abnormal findings: Secondary | ICD-10-CM | POA: Diagnosis not present

## 2017-12-24 DIAGNOSIS — E785 Hyperlipidemia, unspecified: Secondary | ICD-10-CM | POA: Diagnosis not present

## 2017-12-24 DIAGNOSIS — R5383 Other fatigue: Secondary | ICD-10-CM | POA: Diagnosis not present

## 2017-12-24 DIAGNOSIS — R6882 Decreased libido: Secondary | ICD-10-CM | POA: Diagnosis not present

## 2017-12-24 DIAGNOSIS — Z131 Encounter for screening for diabetes mellitus: Secondary | ICD-10-CM | POA: Diagnosis not present

## 2017-12-24 DIAGNOSIS — N529 Male erectile dysfunction, unspecified: Secondary | ICD-10-CM | POA: Diagnosis not present

## 2017-12-24 DIAGNOSIS — R42 Dizziness and giddiness: Secondary | ICD-10-CM | POA: Diagnosis not present

## 2017-12-24 DIAGNOSIS — Z0389 Encounter for observation for other suspected diseases and conditions ruled out: Secondary | ICD-10-CM | POA: Diagnosis not present

## 2018-01-02 DIAGNOSIS — J019 Acute sinusitis, unspecified: Secondary | ICD-10-CM | POA: Diagnosis not present

## 2018-01-09 ENCOUNTER — Telehealth: Payer: Self-pay | Admitting: Genetics

## 2018-01-09 NOTE — Telephone Encounter (Signed)
A genetic counseling appt has been scheduled to see Lee Hughes on 2/3 at 4pm. The appt date and time have been given to the pt's wife.

## 2018-01-14 ENCOUNTER — Encounter: Payer: Self-pay | Admitting: Cardiovascular Disease

## 2018-01-28 DIAGNOSIS — E785 Hyperlipidemia, unspecified: Secondary | ICD-10-CM | POA: Diagnosis not present

## 2018-01-28 DIAGNOSIS — E559 Vitamin D deficiency, unspecified: Secondary | ICD-10-CM | POA: Diagnosis not present

## 2018-01-28 DIAGNOSIS — R7302 Impaired glucose tolerance (oral): Secondary | ICD-10-CM | POA: Diagnosis not present

## 2018-01-28 DIAGNOSIS — R6882 Decreased libido: Secondary | ICD-10-CM | POA: Diagnosis not present

## 2018-01-31 MED FILL — TAMSULOSIN HCL 0.4 MG CAP: 0.4 | 30 days supply | Qty: 60 | Fill #3

## 2018-01-31 MED FILL — OMEGA-3 ETHYL ESTER 1 GM CA: 1 | 30 days supply | Qty: 120 | Fill #2

## 2018-02-03 ENCOUNTER — Inpatient Hospital Stay: Payer: 59 | Admitting: Genetics

## 2018-02-11 ENCOUNTER — Encounter: Payer: Self-pay | Admitting: Cardiology

## 2018-02-11 NOTE — Progress Notes (Signed)
Cardiology Office Note  Date: 02/12/2018   ID: Petra, Sargeant [birth year], MRN [id number]  PCP: Doree Albee, MD  Consulting Cardiologist: Rozann Lesches, MD   Chief Complaint  Patient presents with  . Episodic dizziness    History of Present Illness: Lee Hughes is a 58 y.o. male referred for cardiology consultation by Ms. Pearline Cables NP for the evaluation of dizziness and reported bradycardia.  He presents today to discuss symptoms.  He recalls an initial feeling of lightheadedness and presyncope when he was out shopping with his wife several months ago.  He felt okay initially, but while he was walking eventually felt like he might fall if he kept stepping forward, had to sit down.  Eventually the symptoms resolved.  At that time he was on a higher dose of Flomax and this was cut down to the current dose.  Since that time he has had a few episodes of orthostatic dizziness, both occurred at work.  He stopped Crestor on the most recent occasion since he had had prior statin intolerance.  Since then he has also cut out soft drinks and is drinking more water, he has had no recurrent symptoms.  ECG done at [facility] at office visit on December 24 reportedly showed sinus bradycardia with PACs, heart rate 49, no acute ST segment changes.  The actual tracing is not available to review.  He does not describe any sudden onset of syncope.  Have previous history of tick bites but no definite Lyme disease or conduction system abnormalities.  I personally reviewed his ECG today which shows normal sinus rhythm, normal intervals, nondiagnostic inferior Q waves.  I reviewed his medications which are outlined below.  He is still on Flomax although at 0.4 mg daily.  Orthostatic measurements made in the office today were within normal limits.  He works at [employer], states that he is not around any fumes or extremes of temperature.  Past Medical History:  Diagnosis Date  .  Angio-edema   . Arthritis    bilateral fingers  . Asthma    as child, no longer problematic  . Bladder cancer [facility])    Status post surgery and immunotherapy 2018  . Fatty liver   . Hypercholesteremia   . Irritable bowel syndrome   . Nephrolithiasis   . Throat swelling    from tick  . Tongue swelling    from tick  . Urticaria    from tick  . Varicella zoster     Past Surgical History:  Procedure Laterality Date  . COLONOSCOPY N/A 09/09/2012   Procedure: COLONOSCOPY;  Surgeon: Jamesetta So, MD;  [facility];  Service: Gastroenterology;  Laterality: N/A;  . Left femur Left 1988  . TRANSURETHRAL RESECTION OF BLADDER TUMOR N/A 12/18/2016   Procedure: CYSTOSCOPY TRANSURETHRAL RESECTION OF BLADDER TUMOR (TURBT);  Surgeon: [name], MD;  Location: [facility];  Service: Urology;  Laterality: N/A;    Current Outpatient Medications  Medication Sig Dispense Refill  . aspirin 81 MG chewable tablet Chew 81 mg by mouth daily.     . Cetirizine HCl (ZYRTEC ALLERGY) 10 MG CAPS Take by mouth 2 (two) times daily.     . Cholecalciferol (VITAMIN D-3) 125 MCG (5000 UT) TABS Take by mouth.    . Multiple Vitamin (MULTIVITAMIN) capsule Take by mouth daily.     . niacin (NIASPAN) 1000 MG CR tablet Take 3,000 mg by mouth.    . omega-3  acid ethyl esters (LOVAZA) 1 g capsule Take 2 g by mouth 2 (two) times daily.     . tamsulosin (FLOMAX) 0.4 MG CAPS capsule Take 0.4 mg by mouth.     No current facility-administered medications for this visit.    Allergies:  Other   Social History: The patient  reports that he has never smoked. He has never used smokeless tobacco. He reports current alcohol use. He reports that he does not use drugs.   Family History: The patient's family history includes Alcoholism in his father and mother; Broken bones in his father; Pancreatic cancer in his brother.   ROS:  Please see the history of present illness. Otherwise, complete review of  systems is positive for none.  All other systems are reviewed and negative.   Physical Exam: VS:  BP 128/70   Pulse 61   Ht 5\' 11"  (1.803 m)   Wt 202 lb (91.6 kg)   SpO2 97%   BMI 28.17 kg/m , BMI Body mass index is 28.17 kg/m.  Wt Readings from Last 3 Encounters:  02/12/18 202 lb (91.6 kg)  12/18/16 205 lb 6.4 oz (93.2 kg)  03/19/16 203 lb (92.1 kg)    General: Patient appears comfortable at rest. HEENT: Conjunctiva and lids normal, oropharynx clear. Neck: Supple, no elevated JVP or carotid bruits, no thyromegaly. Lungs: Clear to auscultation, nonlabored breathing at rest. Cardiac: Regular rate and rhythm, no S3 or significant systolic murmur, no pericardial rub. Abdomen: Soft, nontender, bowel sounds present, no guarding or rebound. Extremities: No pitting edema, distal pulses 2+. Skin: Warm and dry. Musculoskeletal: No kyphosis. Neuropsychiatric: Alert and oriented x3, affect grossly appropriate.  ECG: There is no old tracing available for comparison today.  Recent Labwork: 04/30/2017: ALT 32; AST 31; BUN 15; Creatinine, Ser 1.15; Hemoglobin 14.5; Platelets 285; Potassium 4.8; Sodium 141     Component Value Date/Time   CHOL 166 04/30/2017 0923   TRIG 182 (H) 04/30/2017 0923   HDL 39 (L) 04/30/2017 0923   LDLCALC 91 04/30/2017 0923  December 2019: Total testosterone 330, free testosterone 74.3, hemoglobin A1c 5.7%, BUN 22, creatinine 1.17, potassium 4.4, AST 27, ALT 28, hemoglobin 14.3, platelets 307  Other Studies Reviewed Today:  Abdominal and pelvic CT 11/12/2016: IMPRESSION: 1. Multiple nonobstructive calculi are noted within both of the renal collecting systems measuring up to 5 mm in the lower pole collecting system of left kidney. No ureteral stones or findings of urinary tract obstruction are noted at this time. 2. There is also a tiny 2 mm non dependent calcification which appears to be associated with an enhancing urothelial lesion in the left side of the  urinary bladder immediately anterior to the left ureterovesicular junction, suspicious for a small urothelial neoplasm, currently measuring 1.4 x 1.2 cm, as discussed above. Further evaluation with cystoscopy is suggested, if clinically appropriate. 3. No lymphadenopathy or definite signs of metastatic disease are noted in the abdomen or pelvis at this time. 4. Aortic atherosclerosis. 5. Mild colonic diverticulosis without evidence of acute diverticulitis at this time.  Assessment and Plan:  1.  Episodic dizziness, orthostatic in description.  He has had no definitive syncope or palpitations.  Flomax could be a contributor, although he is not frankly orthostatic today.  He is no longer drinking soft drinks, now more water, has had no events in the last month or so.  I doubt that this is arrhythmogenic however we will obtain a 48-hour Holter monitor to better investigate heart rate variability,  mainly to exclude any unusual degrees of bradycardia.  His ECG today is normal.  2.  Mixed hyperlipidemia, currently on Lovaza and Niaspan.  He follows with Dr. Anastasio Champion.  3.  Incidentally described aortic atherosclerosis by prior CT imaging in 2018.  I noted this based on chart review, patient is asymptomatic.  Agree with aspirin daily and more optimal control of lipids.  Current medicines were reviewed with the patient today.   Orders Placed This Encounter  Procedures  . Holter monitor - 48 hour  . EKG 12-Lead    Disposition: Call with test results.  Signed, Satira Sark, MD, [facility] 02/12/2018 9:34 AM    [place] [facility] [address], [place], [place] [address] Phone: [phone]; Fax: [phone]

## 2018-02-12 ENCOUNTER — Ambulatory Visit (INDEPENDENT_AMBULATORY_CARE_PROVIDER_SITE_OTHER): Payer: 59 | Admitting: Cardiology

## 2018-02-12 ENCOUNTER — Encounter: Payer: Self-pay | Admitting: Cardiology

## 2018-02-12 VITALS — BP 128/70 | HR 61 | Ht 71.0 in | Wt 202.0 lb

## 2018-02-12 DIAGNOSIS — I700 Atherosclerosis of aorta: Secondary | ICD-10-CM | POA: Diagnosis not present

## 2018-02-12 DIAGNOSIS — R42 Dizziness and giddiness: Secondary | ICD-10-CM | POA: Diagnosis not present

## 2018-02-12 DIAGNOSIS — E782 Mixed hyperlipidemia: Secondary | ICD-10-CM

## 2018-02-12 NOTE — Patient Instructions (Signed)
Medication Instructions: Your physician recommends that you continue on your current medications as directed. Please refer to the Current Medication list given to you today.   Labwork: None today  Procedures/Testing: Your physician has recommended that you wear a holter monitor for 48 hours. Holter monitors are medical devices that record the heart's electrical activity. Doctors most often use these monitors to diagnose arrhythmias. Arrhythmias are problems with the speed or rhythm of the heartbeat. The monitor is a small, portable device. You can wear one while you do your normal daily activities. This is usually used to diagnose what is causing palpitations/syncope (passing out).    Follow-Up: To be determined after monitor  Any Additional Special Instructions Will Be Listed Below (If Applicable).     If you need a refill on your cardiac medications before your next appointment, please call your pharmacy.        Thank you for choosing [facility] !

## 2018-02-13 ENCOUNTER — Ambulatory Visit (INDEPENDENT_AMBULATORY_CARE_PROVIDER_SITE_OTHER): Payer: 59

## 2018-02-13 DIAGNOSIS — R42 Dizziness and giddiness: Secondary | ICD-10-CM

## 2018-03-07 ENCOUNTER — Ambulatory Visit (INDEPENDENT_AMBULATORY_CARE_PROVIDER_SITE_OTHER): Payer: 59 | Admitting: Urology

## 2018-03-07 ENCOUNTER — Other Ambulatory Visit (HOSPITAL_COMMUNITY)
Admission: RE | Admit: 2018-03-07 | Discharge: 2018-03-07 | Disposition: A | Discharge status: Home / Self Care | Payer: 59 | Source: Ambulatory Visit | Attending: Urology | Admitting: Urology

## 2018-03-07 DIAGNOSIS — R3912 Poor urinary stream: Secondary | ICD-10-CM

## 2018-03-07 DIAGNOSIS — Z8551 Personal history of malignant neoplasm of bladder: Secondary | ICD-10-CM

## 2018-03-07 DIAGNOSIS — N5201 Erectile dysfunction due to arterial insufficiency: Secondary | ICD-10-CM

## 2018-03-07 DIAGNOSIS — N401 Enlarged prostate with lower urinary tract symptoms: Secondary | ICD-10-CM

## 2018-03-17 ENCOUNTER — Encounter (INDEPENDENT_AMBULATORY_CARE_PROVIDER_SITE_OTHER): Payer: Self-pay | Admitting: Internal Medicine

## 2018-03-17 DIAGNOSIS — R6882 Decreased libido: Secondary | ICD-10-CM | POA: Diagnosis not present

## 2018-03-17 DIAGNOSIS — N529 Male erectile dysfunction, unspecified: Secondary | ICD-10-CM | POA: Diagnosis not present

## 2018-03-17 DIAGNOSIS — E785 Hyperlipidemia, unspecified: Secondary | ICD-10-CM | POA: Diagnosis not present

## 2018-03-17 DIAGNOSIS — Z125 Encounter for screening for malignant neoplasm of prostate: Secondary | ICD-10-CM | POA: Diagnosis not present

## 2018-03-31 DIAGNOSIS — N200 Calculus of kidney: Secondary | ICD-10-CM | POA: Diagnosis not present

## 2018-03-31 DIAGNOSIS — M549 Dorsalgia, unspecified: Secondary | ICD-10-CM | POA: Diagnosis not present

## 2018-04-09 MED FILL — TAMSULOSIN HCL 0.4 MG CAP: 0.4 | 30 days supply | Qty: 60 | Fill #0

## 2018-04-09 MED FILL — OMEGA-3 ETHYL ESTERS 1 GM C: 1 | 30 days supply | Qty: 120 | Fill #0

## 2018-06-02 ENCOUNTER — Ambulatory Visit (INDEPENDENT_AMBULATORY_CARE_PROVIDER_SITE_OTHER): Payer: 59 | Admitting: Internal Medicine

## 2018-06-02 DIAGNOSIS — E559 Vitamin D deficiency, unspecified: Secondary | ICD-10-CM | POA: Diagnosis not present

## 2018-06-02 DIAGNOSIS — E785 Hyperlipidemia, unspecified: Secondary | ICD-10-CM | POA: Diagnosis not present

## 2018-06-02 DIAGNOSIS — R7302 Impaired glucose tolerance (oral): Secondary | ICD-10-CM | POA: Diagnosis not present

## 2018-06-02 DIAGNOSIS — R6882 Decreased libido: Secondary | ICD-10-CM | POA: Diagnosis not present

## 2018-06-05 DIAGNOSIS — R6882 Decreased libido: Secondary | ICD-10-CM | POA: Diagnosis not present

## 2018-06-09 MED FILL — OMEGA-3 ETHYL ESTERS 1 GM C: 1 | 30 days supply | Qty: 120 | Fill #1

## 2018-06-09 MED FILL — TAMSULOSIN HCL 0.4 MG CAP: 0.4 | 30 days supply | Qty: 60 | Fill #1

## 2018-06-13 ENCOUNTER — Inpatient Hospital Stay (HOSPITAL_COMMUNITY): Admit: 2018-06-13 | Payer: 59

## 2018-06-13 ENCOUNTER — Ambulatory Visit (INDEPENDENT_AMBULATORY_CARE_PROVIDER_SITE_OTHER): Payer: 59 | Admitting: Urology

## 2018-06-13 ENCOUNTER — Other Ambulatory Visit (HOSPITAL_COMMUNITY)
Admission: RE | Admit: 2018-06-13 | Discharge: 2018-06-13 | Disposition: A | Discharge status: Home / Self Care | Payer: 59 | Source: Ambulatory Visit | Attending: Urology | Admitting: Urology

## 2018-06-13 DIAGNOSIS — C672 Malignant neoplasm of lateral wall of bladder: Secondary | ICD-10-CM

## 2018-06-13 DIAGNOSIS — R82998 Other abnormal findings in urine: Secondary | ICD-10-CM | POA: Diagnosis not present

## 2018-06-13 DIAGNOSIS — Z8551 Personal history of malignant neoplasm of bladder: Secondary | ICD-10-CM | POA: Diagnosis not present

## 2018-06-20 ENCOUNTER — Ambulatory Visit (INDEPENDENT_AMBULATORY_CARE_PROVIDER_SITE_OTHER): Payer: 59 | Admitting: Urology

## 2018-06-20 DIAGNOSIS — C672 Malignant neoplasm of lateral wall of bladder: Secondary | ICD-10-CM

## 2018-06-27 ENCOUNTER — Ambulatory Visit (INDEPENDENT_AMBULATORY_CARE_PROVIDER_SITE_OTHER): Payer: 59 | Admitting: Urology

## 2018-06-27 DIAGNOSIS — C672 Malignant neoplasm of lateral wall of bladder: Secondary | ICD-10-CM | POA: Diagnosis not present

## 2018-07-02 ENCOUNTER — Ambulatory Visit (INDEPENDENT_AMBULATORY_CARE_PROVIDER_SITE_OTHER): Payer: 59 | Admitting: Urology

## 2018-07-02 DIAGNOSIS — C672 Malignant neoplasm of lateral wall of bladder: Secondary | ICD-10-CM

## 2018-07-07 DIAGNOSIS — F321 Major depressive disorder, single episode, moderate: Secondary | ICD-10-CM | POA: Diagnosis not present

## 2018-08-07 MED FILL — OMEGA-3 ETHYL ESTERS 1 GM C: 1 | 30 days supply | Qty: 120 | Fill #0

## 2018-08-12 MED FILL — TAMSULOSIN HCL 0.4 MG CAP: 0.4 | 30 days supply | Qty: 60 | Fill #2

## 2018-08-14 ENCOUNTER — Emergency Department (HOSPITAL_COMMUNITY): Payer: 59

## 2018-08-14 ENCOUNTER — Encounter (HOSPITAL_COMMUNITY): Payer: Self-pay | Admitting: Emergency Medicine

## 2018-08-14 ENCOUNTER — Other Ambulatory Visit: Payer: Self-pay

## 2018-08-14 ENCOUNTER — Emergency Department (HOSPITAL_COMMUNITY)
Admission: EM | Admit: 2018-08-14 | Discharge: 2018-08-15 | Disposition: A | Discharge status: Home / Self Care | Payer: 59 | Attending: Emergency Medicine | Admitting: Emergency Medicine

## 2018-08-14 DIAGNOSIS — L539 Erythematous condition, unspecified: Secondary | ICD-10-CM | POA: Diagnosis present

## 2018-08-14 DIAGNOSIS — Z8551 Personal history of malignant neoplasm of bladder: Secondary | ICD-10-CM | POA: Insufficient documentation

## 2018-08-14 DIAGNOSIS — L03113 Cellulitis of right upper limb: Secondary | ICD-10-CM | POA: Diagnosis not present

## 2018-08-14 DIAGNOSIS — Z7982 Long term (current) use of aspirin: Secondary | ICD-10-CM | POA: Insufficient documentation

## 2018-08-14 DIAGNOSIS — Z79899 Other long term (current) drug therapy: Secondary | ICD-10-CM | POA: Insufficient documentation

## 2018-08-14 DIAGNOSIS — M19041 Primary osteoarthritis, right hand: Secondary | ICD-10-CM | POA: Diagnosis not present

## 2018-08-14 DIAGNOSIS — L03119 Cellulitis of unspecified part of limb: Secondary | ICD-10-CM

## 2018-08-14 LAB — COMPREHENSIVE METABOLIC PANEL
ALT: 24 U/L (ref 0–44)
AST: 25 U/L (ref 15–41)
Albumin: 3.9 g/dL (ref 3.5–5.0)
Alkaline Phosphatase: 81 U/L (ref 38–126)
Anion gap: 11 (ref 5–15)
BUN: 14 mg/dL (ref 6–20)
CO2: 22 mmol/L (ref 22–32)
Calcium: 9.3 mg/dL (ref 8.9–10.3)
Chloride: 104 mmol/L (ref 98–111)
Creatinine, Ser: 0.88 mg/dL (ref 0.61–1.24)
GFR calc Af Amer: >60 mL/min (ref >60)
GFR calc non Af Amer: >60 mL/min (ref >60)
Glucose, Bld: 104 mg/dL — ABNORMAL HIGH (ref 70–99)
Potassium: 3.9 mmol/L (ref 3.5–5.1)
Sodium: 137 mmol/L (ref 135–145)
Total Bilirubin: 0.8 mg/dL (ref 0.3–1.2)
Total Protein: 6.8 g/dL (ref 6.5–8.1)

## 2018-08-14 LAB — CBC WITH DIFFERENTIAL/PLATELET
Abs Immature Granulocytes: 0.03 10*3/uL (ref 0.00–0.07)
Basophils Absolute: 0.1 10*3/uL (ref 0.0–0.1)
Basophils Relative: 1 %
Eosinophils Absolute: 0.2 10*3/uL (ref 0.0–0.5)
Eosinophils Relative: 2 %
HCT: 44.8 % (ref 39.0–52.0)
Hemoglobin: 14.6 g/dL (ref 13.0–17.0)
Immature Granulocytes: 0 %
Lymphocytes Relative: 19 %
Lymphs Abs: 1.7 10*3/uL (ref 0.7–4.0)
MCH: 25.6 pg — ABNORMAL LOW (ref 26.0–34.0)
MCHC: 32.6 g/dL (ref 30.0–36.0)
MCV: 78.5 fL — ABNORMAL LOW (ref 80.0–100.0)
Monocytes Absolute: 0.8 10*3/uL (ref 0.1–1.0)
Monocytes Relative: 9 %
Neutro Abs: 6.2 10*3/uL (ref 1.7–7.7)
Neutrophils Relative %: 69 %
Platelets: 223 10*3/uL (ref 150–400)
RBC: 5.71 MIL/uL (ref 4.22–5.81)
RDW: 14.7 % (ref 11.5–15.5)
WBC: 9 10*3/uL (ref 4.0–10.5)
nRBC: 0 % (ref 0.0–0.2)

## 2018-08-14 LAB — LACTIC ACID, PLASMA: Lactic Acid, Venous: 0.9 mmol/L (ref 0.5–1.9)

## 2018-08-14 MED ORDER — SODIUM CHLORIDE 0.9% FLUSH: 3.0000 mL | Freq: Once | INTRAVENOUS | Status: DC

## 2018-08-14 NOTE — ED Triage Notes (Signed)
Pt reports he had a bug bite to his R hand 2 days ago. Pt reports it started to have swelling, clear drainage. He has red streaking up his R arm. Pt reports taking 2 doses of amoxicillin from previous infection with no relief.

## 2018-08-14 NOTE — ED Notes (Signed)
[phone] wife Lee Hughes

## 2018-08-14 NOTE — ED Notes (Signed)
Pt has redness and swelling to right index finger and right hand

## 2018-08-15 DIAGNOSIS — Z8551 Personal history of malignant neoplasm of bladder: Secondary | ICD-10-CM | POA: Diagnosis not present

## 2018-08-15 DIAGNOSIS — Z79899 Other long term (current) drug therapy: Secondary | ICD-10-CM | POA: Diagnosis not present

## 2018-08-15 DIAGNOSIS — Z7982 Long term (current) use of aspirin: Secondary | ICD-10-CM | POA: Diagnosis not present

## 2018-08-15 DIAGNOSIS — L03113 Cellulitis of right upper limb: Secondary | ICD-10-CM | POA: Diagnosis not present

## 2018-08-15 MED ORDER — SULFAMETHOXAZOLE-TRIMETHOPRIM 800-160 MG PO TABS: 1.0000 | ORAL_TABLET | Freq: Two times a day (BID) | ORAL | 0 refills | Status: AC

## 2018-08-15 MED ORDER — SULFAMETHOXAZOLE-TRIMETHOPRIM 800-160 MG PO TABS
1.0000 | ORAL_TABLET | Freq: Once | ORAL | Status: AC
  Administered 2018-08-15: 1 via ORAL
  Filled 2018-08-15: qty 1

## 2018-08-15 MED ORDER — AMOXICILLIN-POT CLAVULANATE 875-125 MG PO TABS: 1.0000 | ORAL_TABLET | Freq: Two times a day (BID) | ORAL | 0 refills | Status: DC

## 2018-08-15 MED ORDER — AMOXICILLIN-POT CLAVULANATE 875-125 MG PO TABS
1.0000 | ORAL_TABLET | Freq: Once | ORAL | Status: AC
  Administered 2018-08-15: 1 via ORAL
  Filled 2018-08-15: qty 1

## 2018-08-15 NOTE — ED Provider Notes (Signed)
[place] EMERGENCY DEPARTMENT Provider Note   CSN: [id number] Arrival date & time: 08/14/18  1703     History   Chief Complaint Chief Complaint  Patient presents with  . Cellulitis    HPI Lee Hughes is a 57 y.o. male.     The history is provided by the patient.  Hand Pain This is a new problem. The current episode started 2 days ago. The problem occurs constantly. The problem has been gradually worsening. Pertinent negatives include no chest pain and no abdominal pain. Nothing aggravates the symptoms. Nothing relieves the symptoms. Treatments tried: Amoxicillin. The treatment provided no relief.  Patient with history of hypercholesterolemia presents with right hand pain/possible infection Patient reports approximately 2 days ago he began having pain and swelling at the base of his right index finger.  No trauma.  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  He does not recall specific injury.  He thought it might be a bug bite.  Over the past 24 hours the redness has worsened with streaking up his arm.  It did drain, but that has stopped. No fight bite injury.  No fevers.  He has taken amoxicillin to have a previous infection that has not improved his symptoms  Past Medical History:  Diagnosis Date  . Angio-edema   . Arthritis    bilateral fingers  . Asthma    as child, no longer problematic  . Bladder cancer [facility])    Status post surgery and immunotherapy 2018  . Fatty liver   . Hypercholesteremia   . Irritable bowel syndrome   . Nephrolithiasis   . Throat swelling    from tick  . Tongue swelling    from tick  . Urticaria    from tick  . Varicella zoster     Patient Active Problem List   Diagnosis Date Noted  . Familial hypercholesterolemia 01/31/2015    Past Surgical History:  Procedure Laterality Date  . COLONOSCOPY N/A 09/09/2012   Procedure: COLONOSCOPY;  Surgeon: Jamesetta So, MD;  [facility];   Service: Gastroenterology;  Laterality: N/A;  . Left femur Left 1988  . TRANSURETHRAL RESECTION OF BLADDER TUMOR N/A 12/18/2016   Procedure: CYSTOSCOPY TRANSURETHRAL RESECTION OF BLADDER TUMOR (TURBT);  Surgeon: [name], MD;  Location: [facility];  Service: Urology;  Laterality: N/A;        Home Medications    Prior to Admission medications   Medication Sig Start Date End Date Taking? Authorizing Provider  aspirin 81 MG chewable tablet Chew 81 mg by mouth daily.     [provider]  Cetirizine HCl (ZYRTEC ALLERGY) 10 MG CAPS Take by mouth 2 (two) times daily.     [provider]  Cholecalciferol (VITAMIN D-3) 125 MCG (5000 UT) TABS Take by mouth.    [provider]  Multiple Vitamin (MULTIVITAMIN) capsule Take by mouth daily.     [provider]  niacin (NIASPAN) 1000 MG CR tablet Take 3,000 mg by mouth.    [provider]  omega-3 acid ethyl esters (LOVAZA) 1 g capsule Take 2 g by mouth 2 (two) times daily.  08/01/15   [provider]  tamsulosin (FLOMAX) 0.4 MG CAPS capsule Take 0.4 mg by mouth.    [provider]    Family History Family History  Problem Relation Age of Onset  . Pancreatic cancer Brother   . Alcoholism Mother   .  Alcoholism Father   . Broken bones Father   . Allergic rhinitis Neg Hx   . Angioedema Neg Hx   . Asthma Neg Hx   . Atopy Neg Hx   . Eczema Neg Hx   . Immunodeficiency Neg Hx   . Urticaria Neg Hx     Social History Social History   Tobacco Use  . Smoking status: Never Smoker  . Smokeless tobacco: Never Used  Substance Use Topics  . Alcohol use: Yes    Comment: Occasionally  . Drug use: No     Allergies   Other   Review of Systems Review of Systems  Constitutional: Negative for fever.  Cardiovascular: Negative for chest pain.  Gastrointestinal: Negative for abdominal pain.  Musculoskeletal: Positive for arthralgias.  Skin: Positive for color change  and wound.  All other systems reviewed and are negative.    Physical Exam Updated Vital Signs BP 130/76 (BP Location: Left Arm)   Pulse (!) 55   Temp 98.8 F (37.1 C) (Oral)   Resp 18   SpO2 98%   Physical Exam CONSTITUTIONAL: Well developed/well nourished HEAD: Normocephalic/atraumatic EYES: EOMI/PERRL ENMT: Mask in place NECK: supple no meningeal signs SPINE/BACK:entire spine nontender CV: S1/S2 noted, no murmurs/rubs/gallops noted LUNGS: Lungs are clear to auscultation bilaterally, no apparent distress ABDOMEN: soft, nontender NEURO: Pt is awake/alert/appropriate, moves all extremitiesx4.  No facial droop.   EXTREMITIES: pulses normal/equal, full ROM Tenderness to the right hand around the right MCP.  No crepitus.  There is induration without fluctuance.  Very mild pain with range of motion of right index finger.  See photo below.  Erythema extends approximately just past the wrist on the extensor surface SKIN: warm, color normal PSYCH: no abnormalities of mood noted, alert and oriented to situation        Patient gave verbal permission to utilize photo for medical documentation only The image was not stored on any personal device ED Treatments / Results  Labs (all labs ordered are listed, but only abnormal results are displayed) Labs Reviewed  COMPREHENSIVE METABOLIC PANEL - Abnormal; Notable for the following components:      Result Value   Glucose, Bld 104 (*)    All other components within normal limits  CBC WITH DIFFERENTIAL/PLATELET - Abnormal; Notable for the following components:   MCV 78.5 (*)    MCH 25.6 (*)    All other components within normal limits  LACTIC ACID, PLASMA    EKG None  Radiology Dg Hand Complete Right  Result Date: 08/14/2018 CLINICAL DATA:  Right hand swelling redness around base of index finger EXAM: RIGHT HAND - COMPLETE 3+ VIEW COMPARISON:  None. FINDINGS: No acute fracture or traumatic malalignment. Focal arthrosis and  joint space narrowing at the first metatarsophalangeal joint with mild adjacent soft tissue swelling. Minimal first carpometacarpal and triscaphe arthrosis is present. Remaining soft tissues of the hand are unremarkable. IMPRESSION: Focal arthrosis at the first metatarsophalangeal joint with mild adjacent soft tissue swelling. No acute osseous abnormality. Electronically Signed   By: Lovena Le M.D.   On: 08/14/2018 17:59    Procedures Ultrasound ED Soft Tissue  Date/Time: 08/15/2018 12:19 AM Performed by: Ripley Fraise, MD Authorized by: Ripley Fraise, MD   Procedure details:    Indications: localization of abscess and evaluate for cellulitis     Transverse view:  Visualized   Images: archived     Limitations:  Positioning Location:    Location: upper extremity     Side:  Right Findings:     no abscess present    cellulitis present     Medications Ordered in ED Medications  sodium chloride flush (NS) 0.9 % injection 3 mL (has no administration in time range)     Initial Impression / Assessment and Plan / ED Course  I have reviewed the triage vital signs and the nursing notes.  Pertinent labs & imaging results that were available during my care of the patient were reviewed by me and considered in my medical decision making (see chart for details).        12:19 AM Patient presents with cellulitis of the right hand.  He denies any fight bite injuries.  This may be work-related.  I am not able to identify any abscess on ultrasound There is no crepitus.  There is some pain with range of motion of the flexion-extension of the right index finger.  I will consult hand. Labs are reassuring. 12:48 AM Discussed the case with Dr. Jeannie Fend with hand surgery. He has reviewed the images.  We discussed the case.  I informed him that there is no obvious abscess.  Patient is otherwise well-appearing.  Patient can flex the right index finger and can make a fist. At this point  patient will just need antibiotics and no operative management. Plan will be to start him on dual antibiotic therapy to include coverage for MRSA. Patient prefers to go home and does not want to be admitted. Advised that if there is any worsening redness or swelling to the hand, or streaking of redness up his arm or fevers over the next 2 to 3 days he must return to ER.  He will follow-up with a hand surgeon on August 18 Final Clinical Impressions(s) / ED Diagnoses   Final diagnoses:  Cellulitis of hand    ED Discharge Orders         Ordered    amoxicillin-clavulanate (AUGMENTIN) 875-125 MG tablet  2 times daily     08/15/18 0042    sulfamethoxazole-trimethoprim (BACTRIM DS) 800-160 MG tablet  2 times daily     08/15/18 0042           Ripley Fraise, MD 08/15/18 [phone]

## 2018-08-19 DIAGNOSIS — M79644 Pain in right finger(s): Secondary | ICD-10-CM | POA: Diagnosis not present

## 2018-08-19 DIAGNOSIS — L02511 Cutaneous abscess of right hand: Secondary | ICD-10-CM | POA: Diagnosis not present

## 2018-08-26 DIAGNOSIS — L02519 Cutaneous abscess of unspecified hand: Secondary | ICD-10-CM | POA: Insufficient documentation

## 2018-08-28 MED FILL — ESCITALOPRAM 5 MG TABLET: 5 | 30 days supply | Qty: 30 | Fill #0

## 2018-09-12 ENCOUNTER — Ambulatory Visit: Payer: 59 | Admitting: Urology

## 2018-09-29 MED FILL — OMEGA-3 ETHYL ESTERS 1 GM C: 1 | 30 days supply | Qty: 120 | Fill #1

## 2018-09-29 MED FILL — TAMSULOSIN HCL 0.4 MG CAP: 0.4 | 30 days supply | Qty: 60 | Fill #3

## 2018-09-29 MED FILL — ESCITALOPRAM 5 MG TABLET: 5 | 30 days supply | Qty: 30 | Fill #1

## 2018-10-10 ENCOUNTER — Other Ambulatory Visit (HOSPITAL_COMMUNITY)
Admission: RE | Admit: 2018-10-10 | Discharge: 2018-10-10 | Disposition: A | Discharge status: Home / Self Care | Payer: 59 | Source: Ambulatory Visit | Attending: Urology | Admitting: Urology

## 2018-10-10 ENCOUNTER — Ambulatory Visit (INDEPENDENT_AMBULATORY_CARE_PROVIDER_SITE_OTHER): Payer: 59 | Admitting: Urology

## 2018-10-10 ENCOUNTER — Other Ambulatory Visit: Payer: Self-pay

## 2018-10-10 DIAGNOSIS — Z8551 Personal history of malignant neoplasm of bladder: Secondary | ICD-10-CM | POA: Diagnosis not present

## 2018-10-10 DIAGNOSIS — C679 Malignant neoplasm of bladder, unspecified: Secondary | ICD-10-CM | POA: Insufficient documentation

## 2018-10-13 DIAGNOSIS — M531 Cervicobrachial syndrome: Secondary | ICD-10-CM | POA: Diagnosis not present

## 2018-10-13 DIAGNOSIS — C679 Malignant neoplasm of bladder, unspecified: Secondary | ICD-10-CM | POA: Diagnosis not present

## 2018-10-13 DIAGNOSIS — S233XXA Sprain of ligaments of thoracic spine, initial encounter: Secondary | ICD-10-CM | POA: Diagnosis not present

## 2018-10-13 DIAGNOSIS — S338XXA Sprain of other parts of lumbar spine and pelvis, initial encounter: Secondary | ICD-10-CM | POA: Diagnosis not present

## 2018-10-14 LAB — CYTOLOGY - NON PAP

## 2018-10-16 DIAGNOSIS — S233XXA Sprain of ligaments of thoracic spine, initial encounter: Secondary | ICD-10-CM | POA: Diagnosis not present

## 2018-10-16 DIAGNOSIS — M531 Cervicobrachial syndrome: Secondary | ICD-10-CM | POA: Diagnosis not present

## 2018-10-16 DIAGNOSIS — S338XXA Sprain of other parts of lumbar spine and pelvis, initial encounter: Secondary | ICD-10-CM | POA: Diagnosis not present

## 2018-10-20 DIAGNOSIS — S233XXA Sprain of ligaments of thoracic spine, initial encounter: Secondary | ICD-10-CM | POA: Diagnosis not present

## 2018-10-20 DIAGNOSIS — S338XXA Sprain of other parts of lumbar spine and pelvis, initial encounter: Secondary | ICD-10-CM | POA: Diagnosis not present

## 2018-10-20 DIAGNOSIS — M531 Cervicobrachial syndrome: Secondary | ICD-10-CM | POA: Diagnosis not present

## 2018-10-23 DIAGNOSIS — M531 Cervicobrachial syndrome: Secondary | ICD-10-CM | POA: Diagnosis not present

## 2018-10-23 DIAGNOSIS — S233XXA Sprain of ligaments of thoracic spine, initial encounter: Secondary | ICD-10-CM | POA: Diagnosis not present

## 2018-10-23 DIAGNOSIS — S338XXA Sprain of other parts of lumbar spine and pelvis, initial encounter: Secondary | ICD-10-CM | POA: Diagnosis not present

## 2018-10-27 ENCOUNTER — Other Ambulatory Visit: Payer: Self-pay

## 2018-10-27 ENCOUNTER — Encounter (INDEPENDENT_AMBULATORY_CARE_PROVIDER_SITE_OTHER): Payer: Self-pay | Admitting: Nurse Practitioner

## 2018-10-27 ENCOUNTER — Ambulatory Visit (INDEPENDENT_AMBULATORY_CARE_PROVIDER_SITE_OTHER): Payer: 59 | Admitting: Nurse Practitioner

## 2018-10-27 VITALS — BP 116/70 | HR 57 | Resp 14 | Ht 71.0 in | Wt 194.4 lb

## 2018-10-27 DIAGNOSIS — E7800 Pure hypercholesterolemia, unspecified: Secondary | ICD-10-CM

## 2018-10-27 DIAGNOSIS — F3341 Major depressive disorder, recurrent, in partial remission: Secondary | ICD-10-CM | POA: Diagnosis not present

## 2018-10-27 DIAGNOSIS — Z23 Encounter for immunization: Secondary | ICD-10-CM

## 2018-10-27 DIAGNOSIS — F329 Major depressive disorder, single episode, unspecified: Secondary | ICD-10-CM | POA: Insufficient documentation

## 2018-10-27 DIAGNOSIS — R7303 Prediabetes: Secondary | ICD-10-CM | POA: Diagnosis not present

## 2018-10-27 DIAGNOSIS — F32A Depression, unspecified: Secondary | ICD-10-CM | POA: Insufficient documentation

## 2018-10-27 MED ORDER — ESCITALOPRAM OXALATE 5 MG PO TABS: 5.0000 mg | ORAL_TABLET | Freq: Every day | ORAL | 1 refills | Status: DC

## 2018-10-27 NOTE — Patient Instructions (Signed)
1.  Depression: Continue on medication as prescribed.  Please call this office if your mood starts to worsen in any way or you experience any thoughts of suicide.  2.  High cholesterol: Continue on current medication as prescribed.  We will check your cholesterol levels at your next office visit.  3.  Prediabetes: We will check your blood work at next office visit.  Further recommendations will be made based on those results  Follow-up as scheduled in December for your annual physical exam, but please call this office with any questions or concerns prior to that appointment.

## 2018-10-27 NOTE — Assessment & Plan Note (Signed)
Previous A1c 5.7.  We will recheck at his next office visit during his annual physical exam.

## 2018-10-27 NOTE — Assessment & Plan Note (Signed)
Patient to continue on current medication as prescribed.  We will check lipid panel at next office visit.  Patient was told to come to next office visit fasting.

## 2018-10-27 NOTE — Assessment & Plan Note (Addendum)
Much improved on Lexapro.  Patient will continue on medication as prescribed.  Refill sent to pharmacy.

## 2018-10-27 NOTE — Assessment & Plan Note (Signed)
Flu shot administered today. 

## 2018-10-27 NOTE — Progress Notes (Signed)
Subjective:  Patient ID: Lee Hughes, male    DOB: Nov 03, 1960  Age: 58 y.o. MRN: [id number]  CC:  Chief Complaint  Patient presents with  . Depression  . Hyperlipidemia  . other    Prediabetes      HPI  This patient today presents for an office visit to follow-up on his chronic conditions.  He has a history of depression and was last seen for this in July 2020.  He was started on Lexapro 5 mg daily.  He tells me he has been taking this as prescribed and it has helped his mood quite a bit.  He is happy with his treatment at this time and denies any suicidal ideation.  He also has a history of hyperlipidemia.  He continues on a daily aspirin, niacin, and omega-3 fatty acid daily.  Last lipid panel was collected in March 2020 and showed total cholesterol of 269, HDL of 39, triglycerides 170, and LDL 196.  I do not believe he was taking a statin at that time.  In addition I do not think that panel was collected in a fasting state.  At his last annual physical exam in December 2019 his A1c did show that he is prediabetic.  He is not on any medication for this at this time.  Past Medical History:  Diagnosis Date  . 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 Arthritis    bilateral fingers  . Asthma    as child, no longer problematic  . Bladder cancer [facility])    Status post surgery and immunotherapy 2018  . Depression   . Fatty liver   . Hypercholesteremia   . Irritable bowel syndrome   . Nephrolithiasis   . Throat swelling    from tick  . Tongue swelling    from tick  . Urticaria    from tick  . Varicella zoster       Family History  Problem Relation Age of Onset  . Pancreatic cancer Brother   . Alcoholism Mother   . Cirrhosis Mother   . Alcoholism Father   . Broken bones Father   . Stroke Maternal Grandmother   . Cancer Maternal Grandfather   . Diabetes Maternal Grandfather   . Emphysema Paternal Grandfather   . Allergic rhinitis Neg Hx   .  Angioedema Neg Hx   . Asthma Neg Hx   . Atopy Neg Hx   . Eczema Neg Hx   . Immunodeficiency Neg Hx   . Urticaria Neg Hx     Social History   Social History Narrative  . Not on file     Current Meds  Medication Sig  . aspirin 81 MG chewable tablet Chew 81 mg by mouth daily.   . Cetirizine HCl (ZYRTEC ALLERGY) 10 MG CAPS Take by mouth 2 (two) times daily.   . Cholecalciferol (VITAMIN D-3) 125 MCG (5000 UT) TABS Take by mouth.  . escitalopram (LEXAPRO) 5 MG tablet Take 1 tablet (5 mg total) by mouth daily.  . Multiple Vitamin (MULTIVITAMIN) capsule Take by mouth daily.   . niacin (NIASPAN) 1000 MG CR tablet Take 1,500 mg by mouth 2 (two) times daily.   Marland Kitchen omega-3 acid ethyl esters (LOVAZA) 1 g capsule Take 2 g by mouth 2 (two) times daily.   . tamsulosin (FLOMAX) 0.4 MG CAPS capsule Take 0.4 mg by mouth.  . [DISCONTINUED] escitalopram (LEXAPRO) 5 MG tablet Take 5 mg by mouth daily.  ROS:  Review of Systems  Constitutional: Negative for chills, fever and malaise/fatigue.  Eyes: Negative for blurred vision and double vision.  Respiratory: Negative for cough, shortness of breath and wheezing.   Cardiovascular: Negative for chest pain and palpitations.  Neurological: Negative for dizziness and headaches.  Psychiatric/Behavioral: Negative for depression and suicidal ideas.     Objective:   Today's Vitals: BP 116/70   Pulse (!) 57   Resp 14   Ht 5\' 11"  (1.803 m)   Wt 194 lb 6.4 oz (88.2 kg)   SpO2 97%   BMI 27.11 kg/m  Vitals with BMI 10/27/2018 08/14/2018 08/14/2018  Height 5\' 11"  - -  Weight 194 lbs 6 oz - -  BMI [id number] - -  Systolic [id number]  Diastolic 70 76 85  Pulse 57 55 73     Physical Exam Vitals signs reviewed.  Constitutional:      Appearance: Normal appearance.  HENT:     Head: Normocephalic and atraumatic.  Neck:     Musculoskeletal: Neck supple.  Cardiovascular:     Rate and Rhythm: Regular rhythm. Bradycardia present.     Pulses: Normal  pulses.     Heart sounds: Normal heart sounds.  Pulmonary:     Effort: Pulmonary effort is normal.     Breath sounds: Normal breath sounds.  Neurological:     Mental Status: He is alert.          Assessment   1. Recurrent major depressive disorder, in partial remission ([place])       Tests ordered No orders of the defined types were placed in this encounter.    Plan: Please see assessment and plan per problem list below.   Meds ordered this encounter  Medications  . escitalopram (LEXAPRO) 5 MG tablet    Sig: Take 1 tablet (5 mg total) by mouth daily.    Dispense:  90 tablet    Refill:  1    Order Specific Question:   Supervising Provider    Answer:   Doree Albee [id number]    Patient to follow-up in 2 months as scheduled.  Ailene Ards, NP

## 2018-10-29 DIAGNOSIS — S233XXA Sprain of ligaments of thoracic spine, initial encounter: Secondary | ICD-10-CM | POA: Diagnosis not present

## 2018-10-29 DIAGNOSIS — M531 Cervicobrachial syndrome: Secondary | ICD-10-CM | POA: Diagnosis not present

## 2018-10-29 DIAGNOSIS — S338XXA Sprain of other parts of lumbar spine and pelvis, initial encounter: Secondary | ICD-10-CM | POA: Diagnosis not present

## 2018-11-03 DIAGNOSIS — S233XXA Sprain of ligaments of thoracic spine, initial encounter: Secondary | ICD-10-CM | POA: Diagnosis not present

## 2018-11-03 DIAGNOSIS — M531 Cervicobrachial syndrome: Secondary | ICD-10-CM | POA: Diagnosis not present

## 2018-11-03 DIAGNOSIS — S338XXA Sprain of other parts of lumbar spine and pelvis, initial encounter: Secondary | ICD-10-CM | POA: Diagnosis not present

## 2018-11-10 DIAGNOSIS — S233XXA Sprain of ligaments of thoracic spine, initial encounter: Secondary | ICD-10-CM | POA: Diagnosis not present

## 2018-11-10 DIAGNOSIS — M531 Cervicobrachial syndrome: Secondary | ICD-10-CM | POA: Diagnosis not present

## 2018-11-10 DIAGNOSIS — S338XXA Sprain of other parts of lumbar spine and pelvis, initial encounter: Secondary | ICD-10-CM | POA: Diagnosis not present

## 2018-11-26 DIAGNOSIS — M531 Cervicobrachial syndrome: Secondary | ICD-10-CM | POA: Diagnosis not present

## 2018-11-26 DIAGNOSIS — S233XXA Sprain of ligaments of thoracic spine, initial encounter: Secondary | ICD-10-CM | POA: Diagnosis not present

## 2018-11-26 DIAGNOSIS — S338XXA Sprain of other parts of lumbar spine and pelvis, initial encounter: Secondary | ICD-10-CM | POA: Diagnosis not present

## 2018-12-03 DIAGNOSIS — M531 Cervicobrachial syndrome: Secondary | ICD-10-CM | POA: Diagnosis not present

## 2018-12-03 DIAGNOSIS — S233XXA Sprain of ligaments of thoracic spine, initial encounter: Secondary | ICD-10-CM | POA: Diagnosis not present

## 2018-12-03 DIAGNOSIS — S338XXA Sprain of other parts of lumbar spine and pelvis, initial encounter: Secondary | ICD-10-CM | POA: Diagnosis not present

## 2018-12-10 DIAGNOSIS — S338XXA Sprain of other parts of lumbar spine and pelvis, initial encounter: Secondary | ICD-10-CM | POA: Diagnosis not present

## 2018-12-10 DIAGNOSIS — M531 Cervicobrachial syndrome: Secondary | ICD-10-CM | POA: Diagnosis not present

## 2018-12-10 DIAGNOSIS — S233XXA Sprain of ligaments of thoracic spine, initial encounter: Secondary | ICD-10-CM | POA: Diagnosis not present

## 2018-12-15 MED FILL — OMEGA-3 ETHYL ESTERS 1 GM C: 1 | 30 days supply | Qty: 120 | Fill #2

## 2018-12-16 MED FILL — TAMSULOSIN HCL 0.4 MG CAP: 0.4 | 30 days supply | Qty: 30 | Fill #0

## 2018-12-23 ENCOUNTER — Encounter: Payer: Self-pay | Admitting: Urology

## 2018-12-30 ENCOUNTER — Encounter (INDEPENDENT_AMBULATORY_CARE_PROVIDER_SITE_OTHER): Payer: Self-pay | Admitting: Internal Medicine

## 2018-12-30 ENCOUNTER — Other Ambulatory Visit: Payer: Self-pay

## 2018-12-30 ENCOUNTER — Encounter (INDEPENDENT_AMBULATORY_CARE_PROVIDER_SITE_OTHER): Payer: 59 | Admitting: Internal Medicine

## 2018-12-30 ENCOUNTER — Ambulatory Visit (INDEPENDENT_AMBULATORY_CARE_PROVIDER_SITE_OTHER): Payer: 59 | Admitting: Internal Medicine

## 2018-12-30 VITALS — BP 114/68 | HR 62 | Temp 97.2°F | Resp 15 | Ht 71.0 in | Wt 195.0 lb

## 2018-12-30 DIAGNOSIS — R200 Anesthesia of skin: Secondary | ICD-10-CM

## 2018-12-30 DIAGNOSIS — R7303 Prediabetes: Secondary | ICD-10-CM

## 2018-12-30 DIAGNOSIS — E7800 Pure hypercholesterolemia, unspecified: Secondary | ICD-10-CM | POA: Diagnosis not present

## 2018-12-30 DIAGNOSIS — F3341 Major depressive disorder, recurrent, in partial remission: Secondary | ICD-10-CM | POA: Diagnosis not present

## 2018-12-30 DIAGNOSIS — R202 Paresthesia of skin: Secondary | ICD-10-CM

## 2018-12-30 NOTE — Progress Notes (Signed)
Metrics: Intervention Frequency ACO  Documented Smoking Status Yearly  Screened one or more times in 24 months  Cessation Counseling or  Active cessation medication Past 24 months  Past 24 months   Guideline developer: UpToDate (See UpToDate for funding source) Date Released: 2014       Wellness Office Visit  Subjective:  Patient ID: Lee Hughes, male    DOB: December 01, 1960  Age: 58 y.o. MRN: [id number]  CC: This man comes in for follow-up of hyperlipidemia, prediabetes and depression. HPI  He has been taking Lexapro for some time and this is really helping him.  Also, he has now found a job and is working consistently which I think is also helping his depression. His hyperlipidemia was fairly significant but he wanted to try with nutrition which I agreed with at the time and I was not keen on putting him on statin therapy.  Thankfully, he has no history of coronary artery disease or cerebrovascular disease. He also had a hemoglobin A1c of 5.7% previously making him prediabetic. Today is also complaining of numbness and altered sensation in the right hand involving the thumb, index finger and middle finger.  He has had these symptoms for approximately 3 years.  He finds that he has to try and shake his hand to relieve symptoms. Past Medical History:  Diagnosis Date  . 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 Arthritis    bilateral fingers  . Asthma    as child, no longer problematic  . Bladder cancer [facility])    Status post surgery and immunotherapy 2018  . Depression   . Fatty liver   . Hypercholesteremia   . Irritable bowel syndrome   . Nephrolithiasis   . Prediabetes   . Throat swelling    from tick  . Tongue swelling    from tick  . Urticaria    from tick  . Varicella zoster       Family History  Problem Relation Age of Onset  . Pancreatic cancer Brother   . Alcoholism Mother   . Cirrhosis Mother   . Alcoholism Father   . Broken bones Father   . Stroke  Maternal Grandmother   . Cancer Maternal Grandfather   . Diabetes Maternal Grandfather   . Emphysema Paternal Grandfather   . Allergic rhinitis Neg Hx   . Angioedema Neg Hx   . Asthma Neg Hx   . Atopy Neg Hx   . Eczema Neg Hx   . Immunodeficiency Neg Hx   . 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   Social History   Tobacco Use  . Smoking status: Never Smoker  . Smokeless tobacco: Never Used  Substance Use Topics  . Alcohol use: Yes    Alcohol/week: 2.0 standard drinks    Types: 2 Cans of beer per week    Comment: per month    Current Meds  Medication Sig  . aspirin 81 MG chewable tablet Chew 81 mg by mouth daily.   . Cetirizine HCl (ZYRTEC ALLERGY) 10 MG CAPS Take by mouth 2 (two) times daily.   . Cholecalciferol (VITAMIN D-3) 125 MCG (5000 UT) TABS Take by mouth.  . escitalopram (LEXAPRO) 5 MG tablet Take 1 tablet (5 mg total) by mouth daily.  . Multiple Vitamin (MULTIVITAMIN) capsule Take by mouth daily.   . niacin (NIASPAN) 1000 MG CR tablet  Take 1,500 mg by mouth 2 (two) times daily.   Marland Kitchen omega-3 acid ethyl esters (LOVAZA) 1 g capsule Take 2 g by mouth 2 (two) times daily.   . tamsulosin (FLOMAX) 0.4 MG CAPS capsule Take 0.4 mg by mouth.      Objective:   Today's Vitals: BP 114/68   Pulse 62   Temp (!) 97.2 F (36.2 C) (Temporal)   Resp 15   Ht 5\' 11"  (1.803 m)   Wt 195 lb (88.5 kg)   SpO2 96%   BMI 27.20 kg/m  Vitals with BMI 12/30/2018 10/27/2018 08/14/2018  Height 5\' 11"  5\' 11"  -  Weight 195 lbs 194 lbs 6 oz -  BMI 123456 [id number] -  Systolic [id number]  Diastolic 68 70 76  Pulse 62 57 55     Physical Exam  Looks systemically well.  Blood pressures well controlled.  Examination of his right hand does not show any wasting of the thenar eminence.  Tinel's sign is negative.     Assessment   1. Prediabetes   2. Hypercholesteremia   3. Recurrent major depressive  disorder, in partial remission ([place])   4. Numbness and tingling in right hand       Tests ordered Orders Placed This Encounter  Procedures  . Ambulatory referral to Neurology     Plan: 1. We discussed his prediabetes and hyperlipidemia.  Previously, I had recommended intermittent fasting but with his schedule, he is not able to accomplish this on a consistent basis.  Therefore, today I discussed a whole food plant-based diet and gave him a handout for this.  He will try and follow this. 2. As far as his symptoms in the right hand are concerned, I will send him to neurology for further evaluation and he may need nerve conduction studies. 3. I will see him in a couple of months time for follow-up and we will do all the blood work again. 4. Today I spent 25 to 30 minutes with patient face-to-face, more than 50% of the time was involved in discussing nutrition above.   No orders of the defined types were placed in this encounter.   Doree Albee, MD

## 2018-12-30 NOTE — Patient Instructions (Signed)
[facility] Dietary Recommendations for Weight Loss What to Avoid . Avoid added sugars o Often added sugar can be found in processed foods such as many condiments, dry cereals, cakes, cookies, chips, crisps, crackers, candies, sweetened drinks, etc.  o Read labels and AVOID/DECREASE use of foods with the following in their ingredient list: Sugar, fructose, high fructose corn syrup, sucrose, glucose, maltose, dextrose, molasses, cane sugar, brown sugar, any type of syrup, agave nectar, etc.   . Avoid snacking in between meals . Avoid foods made with flour o If you are going to eat food made with flour, choose those made with whole-grains; and, minimize your consumption as much as is tolerable . Avoid processed foods o These foods are generally stocked in the middle of the grocery store. Focus on shopping on the perimeter of the grocery.  . Avoid Meat  o We recommend following a plant-based diet at [facility]. Thus, we recommend avoiding meat as a general rule. Consider eating beans, legumes, eggs, and/or dairy products for regular protein sources o If you plan on eating meat limit to 4 ounces of meat at a time and choose lean options such as Fish, chicken, turkey. Avoid red meat intake such as pork and/or steak What to Include . Vegetables o GREEN LEAFY VEGETABLES: Kale, spinach, mustard greens, collard greens, cabbage, broccoli, etc. o OTHER: Asparagus, cauliflower, eggplant, carrots, peas, Brussel sprouts, tomatoes, bell peppers, zucchini, beets, cucumbers, etc. . Grains, seeds, and legumes o Beans: kidney beans, black eyed peas, garbanzo beans, black beans, pinto beans, etc. o Whole, unrefined grains: brown rice, barley, bulgur, oatmeal, etc. . Healthy fats  o Avoid highly processed fats such as vegetable oil o Examples of healthy fats: avocado, olives, virgin olive oil, dark chocolate (?72% Cocoa), nuts (peanuts, almonds, walnuts, cashews, pecans, etc.) . None to Low  Intake of Animal Sources of Protein o Meat sources: chicken, turkey, salmon, tuna. Limit to 4 ounces of meat at one time. o Consider limiting dairy sources, but when choosing dairy focus on: PLAIN Greek yogurt, cottage cheese, high-protein milk . Fruit o Choose berries  When to Eat . Intermittent Fasting: o Choosing not to eat for a specific time period, but DO FOCUS ON HYDRATION when fasting o Multiple Techniques: - Time Restricted Eating: eat 3 meals in a day, each meal lasting no more than 60 minutes, no snacks between meals - 16-18 hour fast: fast for 16 to 18 hours up to 7 days a week. Often suggested to start with 2-3 nonconsecutive days per week.  . Remember the time you sleep is counted as fasting.  . Examples of eating schedule: Fast from 7:00pm-11:00am. Eat between 11:00am-7:00pm.  - 24-hour fast: fast for 24 hours up to every other day. Often suggested to start with 1 day per week . Remember the time you sleep is counted as fasting . Examples of eating schedule:  o Eating day: eat 2-3 meals on your eating day. If doing 2 meals, each meal should last no more than 90 minutes. If doing 3 meals, each meal should last no more than 60 minutes. Finish last meal by 7:00pm. o Fasting day: Fast until 7:00pm.  o IF YOU FEEL UNWELL FOR ANY REASON/IN ANY WAY WHEN FASTING, STOP FASTING BY EATING A NUTRITIOUS SNACK OR LIGHT MEAL o ALWAYS FOCUS ON HYDRATION DURING FASTS - Acceptable Hydration sources: water, broths, tea/coffee (black tea/coffee is best but using a small amount of whole-fat dairy products in coffee/tea is acceptable).  -   Poor Hydration Sources: anything with sugar or artificial sweeteners added to it  These recommendations have been developed for patients that are actively receiving medical care from either Dr. Wanetta Funderburke or Sarah Gray, DNP, NP-C at [facility]. These recommendations are developed for patients with specific medical conditions and are not meant to be  distributed or used by others that are not actively receiving care from either provider listed above at [facility]. It is not appropriate to participate in the above eating plans without proper medical supervision.   Reference: Fung, J. The obesity code. [place]: [place]; 2016.   

## 2019-01-16 MED FILL — TAMSULOSIN HCL 0.4 MG CAP: 0.4 | 30 days supply | Qty: 30 | Fill #1

## 2019-02-04 ENCOUNTER — Other Ambulatory Visit: Payer: Self-pay

## 2019-02-04 ENCOUNTER — Ambulatory Visit (INDEPENDENT_AMBULATORY_CARE_PROVIDER_SITE_OTHER): Payer: 59 | Admitting: Nurse Practitioner

## 2019-02-04 ENCOUNTER — Ambulatory Visit: Payer: 59 | Attending: Internal Medicine

## 2019-02-04 DIAGNOSIS — Z20822 Contact with and (suspected) exposure to covid-19: Secondary | ICD-10-CM | POA: Diagnosis not present

## 2019-02-04 DIAGNOSIS — R05 Cough: Secondary | ICD-10-CM | POA: Diagnosis not present

## 2019-02-04 DIAGNOSIS — R059 Cough, unspecified: Secondary | ICD-10-CM

## 2019-02-04 MED ORDER — NOREL AD 4-10-325 MG PO TABS: 1.0000 | ORAL_TABLET | ORAL | 0 refills | Status: DC | PRN

## 2019-02-04 MED ORDER — BENZONATATE 100 MG PO CAPS: 100.0000 mg | ORAL_CAPSULE | Freq: Two times a day (BID) | ORAL | 0 refills | Status: DC | PRN

## 2019-02-04 NOTE — Progress Notes (Addendum)
Due to national recommendations of social distancing related to the [place] pandemic, an audio/visual tele-health visit was felt to be the most appropriate encounter type for this patient today. I connected with  Lee Hughes on 02/17/19 utilizing audio-only technology and verified that I am speaking with the correct person using two identifiers. The patient was located at their home, and I was located at the office of [facility] during the encounter. I discussed the limitations of evaluation and management by telemedicine. The patient expressed understanding and agreed to proceed.  The patient did not have access to technology that would allow him to complete the visit utilizing video.    Subjective:  Patient ID: Lee Hughes, male    DOB: [birth year]  Age: 59 y.o. MRN: [id number]  CC:  Chief Complaint  Patient presents with  . Cough      HPI  This patient arrives for virtual visit today for acute visit for the above.  He tells me that approximately 3 days ago he started experiencing some head congestion, and has now started experiencing a productive cough.  He tells me he is producing clear sputum.  He denies significant shortness of breath.  He denies fever.  He tells me that he was exposed to a person at his work that may have had COVID-19, but was told that the individual ended up testing negative.  He has been taking over-the-counter Alka-Seltzer cold medicine which has relieved his symptoms mildly.  He is concerned that if he were to have COVID-19 what would his next steps to be.   Past Medical History:  Diagnosis Date  . 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 Arthritis    bilateral fingers  . Asthma    as child, no longer problematic  . Bladder cancer [facility])    Status post surgery and immunotherapy 2018  . Depression   . Fatty liver   . Hypercholesteremia   . Irritable bowel syndrome   . Nephrolithiasis   . Prediabetes   . Throat swelling    from tick  . Tongue swelling    from tick  . Urticaria    from tick  . Varicella zoster       Family History  Problem Relation Age of Onset  . Pancreatic cancer Brother   . Alcoholism Mother   . Cirrhosis Mother   . Alcoholism Father   . Broken bones Father   . Stroke Maternal Grandmother   . Cancer Maternal Grandfather   . Diabetes Maternal Grandfather   . Emphysema Paternal Grandfather   . Allergic rhinitis Neg Hx   . Angioedema Neg Hx   . Asthma Neg Hx   . Atopy Neg Hx   . Eczema Neg Hx   . Immunodeficiency Neg Hx   . 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   Social History   Tobacco Use  . Smoking status: Never Smoker  . Smokeless tobacco: Never Used  Substance Use Topics  . Alcohol use: Yes    Alcohol/week: 2.0 standard drinks    Types: 2 Cans of beer per week    Comment: per month     No outpatient medications have been marked as taking for the 02/04/19 encounter (Office Visit) with Ailene Ards, NP.    ROS:  Review of Systems  Constitutional: Negative for fever.  HENT:  Positive for congestion.   Respiratory: Positive for cough and sputum production. Negative for shortness of breath.      Objective:   Today's Vitals: There were no vitals taken for this visit. Vitals with BMI 02/13/2019 02/04/2019 12/30/2018  Height 5\' 11"  - 5\' 11"   Weight 195 lbs - 195 lbs  BMI 123456 - 123456  Systolic [id number] (No Data) [id number]  Diastolic 81 (No Data) 68  Pulse 73 - 62     Physical Exam Comprehensive physical exam not conducted today as office visit was conducted over the phone.  Patient did answer questions appropriately.  He was able to talk in complete sentences without obvious shortness of breath noted.  No cough was noted during phone call.  He was alert oriented x3 and had appropriate judgment.    Assessment   1. Cough       Tests ordered No orders of the defined types  were placed in this encounter.    Plan: Please see assessment and plan per problem list below.   Meds ordered this encounter  Medications  . benzonatate (TESSALON) 100 MG capsule    Sig: Take 1 capsule (100 mg total) by mouth 2 (two) times daily as needed for cough.    Dispense:  20 capsule    Refill:  0    Order Specific Question:   Supervising Provider    Answer:   Hurshel Party C [id number]  . Chlorphen-PE-Acetaminophen (NOREL AD) 4-10-325 MG TABS    Sig: Take 1 tablet by mouth every 4 (four) hours as needed (Do not exceed 6 tablets in 24 hour period).    Dispense:  84 tablet    Refill:  0    Order Specific Question:   Supervising Provider    Answer:   Doree Albee [id number]    Patient to follow-up as scheduled, follow-up appointment may need to be deferred depending on whether or not patient test positive for COVID-19.  This telephone conversation lasted for 12 minutes.   Ailene Ards, NP

## 2019-02-04 NOTE — Assessment & Plan Note (Signed)
I told the patient that he very well could have COVID-19 and that he should be tested for this.  He tells me he is scheduled to be tested later on this afternoon.  For now I'll prescribe him symptomatic treatment with Tessalon Perles for cough suppression they can take as needed as well as Norel that he can take as needed for decongestion.  I told him that he should quarantine at home until his COVID-19 results have returned.  If he does test positive I told him that if he starts to have shortness of breath he needs to proceed to the hospital.  Otherwise he should quarantine at home.  I told him currently the CDC recommends that a person diagnosed with COVID-19 who had symptoms should be quarantine at home for at least 10 days since symptom onset and for 3 days without fever and without the use of antipyretics.  I also cautioned him that I'm not sure if the [employer] worked in a row are made with any animal products thus he should ask the pharmacist this when his medications are picked up due to his alpha gal allergy.  He tells me he understands.  I encouraged him to let us know what his test results are.  I will give him a work note to stay out of work until results have returned and if he is positive can extend his work note as necessary.

## 2019-02-06 LAB — NOVEL CORONAVIRUS, NAA: SARS-CoV-2, NAA: NOT DETECTED

## 2019-02-13 ENCOUNTER — Ambulatory Visit (INDEPENDENT_AMBULATORY_CARE_PROVIDER_SITE_OTHER): Payer: 59 | Admitting: Urology

## 2019-02-13 ENCOUNTER — Other Ambulatory Visit: Payer: Self-pay

## 2019-02-13 ENCOUNTER — Other Ambulatory Visit (HOSPITAL_COMMUNITY)
Admission: RE | Admit: 2019-02-13 | Discharge: 2019-02-13 | Disposition: A | Discharge status: Home / Self Care | Payer: 59 | Source: Ambulatory Visit | Attending: Urology | Admitting: Urology

## 2019-02-13 ENCOUNTER — Encounter: Payer: Self-pay | Admitting: Urology

## 2019-02-13 VITALS — BP 145/81 | HR 73 | Temp 97.2°F | Ht 71.0 in | Wt 195.0 lb

## 2019-02-13 DIAGNOSIS — N138 Other obstructive and reflux uropathy: Secondary | ICD-10-CM | POA: Insufficient documentation

## 2019-02-13 DIAGNOSIS — Z8551 Personal history of malignant neoplasm of bladder: Secondary | ICD-10-CM | POA: Diagnosis not present

## 2019-02-13 DIAGNOSIS — N5201 Erectile dysfunction due to arterial insufficiency: Secondary | ICD-10-CM | POA: Insufficient documentation

## 2019-02-13 DIAGNOSIS — N529 Male erectile dysfunction, unspecified: Secondary | ICD-10-CM | POA: Insufficient documentation

## 2019-02-13 DIAGNOSIS — R351 Nocturia: Secondary | ICD-10-CM

## 2019-02-13 DIAGNOSIS — N401 Enlarged prostate with lower urinary tract symptoms: Secondary | ICD-10-CM | POA: Diagnosis not present

## 2019-02-13 LAB — POCT URINALYSIS DIPSTICK
Bilirubin, UA: NEGATIVE
Blood, UA: NEGATIVE
Glucose, UA: NEGATIVE
Ketones, UA: NEGATIVE
Nitrite, UA: NEGATIVE
Protein, UA: NEGATIVE
Spec Grav, UA: >=1.03 — AB (ref 1.010–1.025)
Urobilinogen, UA: NEGATIVE E.U./dL — AB
pH, UA: 5 (ref 5.0–8.0)

## 2019-02-13 NOTE — Progress Notes (Signed)
Subjective:  1. History of bladder cancer   2. BPH with urinary obstruction   3. Erectile dysfunction due to arterial insufficiency   4. Nocturia     Lee Hughes returns today in f/u for his history of bladder cancer and returns for cystoscopy. He has had no hematuria. 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   He had a TURBT of a 3cm tumor on 12/18/16 from the left lateral wall. It was multifocal Ta primarily low grade disease with some foci of high risk features. He completed BCG on 02/22/17 and last had maintenance on 07/02/18. He has had no hematuria.   He has progressive ED and was given sildenafil at his last visit but he never got that filled.   IPSS    Row Name 02/13/19 1600         International Prostate Symptom Score   How often have you had the sensation of not emptying your bladder?  Less than 1 in 5     How often have you had to urinate less than every two hours?  Less than 1 in 5 times     How often have you found you stopped and started again several times when you urinated?  Not at All     How often have you found it difficult to postpone urination?  Not at All     How often have you had a weak urinary stream?  About half the time     How often have you had to strain to start urination?  Not at All     How many times did you typically get up at night to urinate?  2 Times     Total IPSS Score  7       Quality of Life due to urinary symptoms   If you were to spend the rest of your life with your urinary condition just the way it is now how would you feel about that?  Mostly Satisfied          ROS:  ROS:  A complete review of systems was performed.  All systems are negative except for pertinent findings as noted.   Review of Systems  All other systems reviewed and are negative.   Allergies  Allergen Reactions  . Other     No beef, pork, deer, products secondary from tick bite    Outpatient Encounter Medications as of 02/13/2019  Medication Sig  .  Omega-3 Fatty Acids (FISH OIL) 1000 MG CAPS Take by mouth.  Marland Kitchen aspirin 81 MG chewable tablet Chew 81 mg by mouth daily.   . benzonatate (TESSALON) 100 MG capsule Take 1 capsule (100 mg total) by mouth 2 (two) times daily as needed for cough.  . Cetirizine HCl (ZYRTEC ALLERGY) 10 MG CAPS Take by mouth 2 (two) times daily.   . Chlorphen-PE-Acetaminophen (NOREL AD) 4-10-325 MG TABS Take 1 tablet by mouth every 4 (four) hours as needed (Do not exceed 6 tablets in 24 hour period).  . Cholecalciferol (VITAMIN D-3) 125 MCG (5000 UT) TABS Take by mouth.  . escitalopram (LEXAPRO) 5 MG tablet Take 1 tablet (5 mg total) by mouth daily.  . Multiple Vitamin (MULTIVITAMIN) capsule Take by mouth daily.   . niacin (NIASPAN) 1000 MG CR tablet Take 1,500 mg by mouth 2 (two) times daily.   . Niacin CR 1000 MG TBCR Take by mouth.  . omega-3 acid ethyl esters (LOVAZA) 1 g capsule Take 2 g by mouth  2 (two) times daily.   . tamsulosin (FLOMAX) 0.4 MG CAPS capsule Take 0.4 mg by mouth.   No facility-administered encounter medications on file as of 02/13/2019.    Past Medical History:  Diagnosis Date  . 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 Arthritis    bilateral fingers  . Asthma    as child, no longer problematic  . Bladder cancer [facility])    Status post surgery and immunotherapy 2018  . Depression   . Fatty liver   . Hypercholesteremia   . Irritable bowel syndrome   . Nephrolithiasis   . Prediabetes   . Throat swelling    from tick  . Tongue swelling    from tick  . Urticaria    from tick  . Varicella zoster     Past Surgical History:  Procedure Laterality Date  . COLONOSCOPY N/A 09/09/2012   Procedure: COLONOSCOPY;  Surgeon: Jamesetta So, MD;  Location: [facility];  Service: Gastroenterology;  Laterality: N/A;  . Left femur Left 1988  . TRANSURETHRAL RESECTION OF BLADDER TUMOR N/A 12/18/2016   Procedure: CYSTOSCOPY TRANSURETHRAL RESECTION OF BLADDER TUMOR (TURBT);  Surgeon: [name], MD;  Location: [facility];  Service: Urology;  Laterality: N/A;    Social History   Socioeconomic History  . Marital status: Married    Spouse name: Not on file  . Number of children: Not on file  . Years of education: Not on file  . Highest education level: Not on file  Occupational History  . Not on file  Tobacco Use  . Smoking status: Never Smoker  . Smokeless tobacco: Never Used  Substance and Sexual Activity  . Alcohol use: Yes    Alcohol/week: 2.0 standard drinks    Types: 2 Cans of beer per week    Comment: per month  . Drug use: No  . Sexual activity: Not Currently  Other Topics Concern  . 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   Social Determinants of Health   Financial Resource Strain:   . Difficulty of Paying Living Expenses: Not on file  Food Insecurity:   . Worried About Charity fundraiser in the Last Year: Not on file  . Ran Out of Food in the Last Year: Not on file  Transportation Needs:   . Lack of Transportation (Medical): Not on file  . Lack of Transportation (Non-Medical): Not on file  Physical Activity:   . Days of Exercise per Week: Not on file  . Minutes of Exercise per Session: Not on file  Stress:   . Feeling of Stress : Not on file  Social Connections:   . Frequency of Communication with Friends and Family: Not on file  . Frequency of Social Gatherings with Friends and Family: Not on file  . Attends Religious Services: Not on file  . Active Member of Clubs or Organizations: Not on file  . Attends Archivist Meetings: Not on file  . Marital Status: Not on file  Intimate Partner Violence:   . Fear of Current or Ex-Partner: Not on file  . Emotionally Abused: Not on file  . Physically Abused: Not on file  . Sexually Abused: Not on file    Family History  Problem Relation Age of Onset  . Pancreatic cancer Brother   . Alcoholism Mother   .  Cirrhosis Mother   . Alcoholism Father   .  Broken bones Father   . Stroke Maternal Grandmother   . Cancer Maternal Grandfather   . Diabetes Maternal Grandfather   . Emphysema Paternal Grandfather   . Allergic rhinitis Neg Hx   . Angioedema Neg Hx   . Asthma Neg Hx   . Atopy Neg Hx   . Eczema Neg Hx   . Immunodeficiency Neg Hx   . Urticaria Neg Hx        Objective: Vitals:   02/13/19 1547  BP: (!) 145/81  Pulse: 73  Temp: (!) 97.2 F (36.2 C)     Physical Exam  Lab Results:  Results for orders placed or performed in visit on 02/13/19 (from the past 24 hour(s))  POCT urinalysis dipstick     Status: Abnormal   Collection Time: 02/13/19  3:55 PM  Result Value Ref Range   Color, UA yellow    Clarity, UA clear    Glucose, UA Negative Negative   Bilirubin, UA neg    Ketones, UA neg    Spec Grav, UA >=1.030 (A) 1.010 - 1.025   Blood, UA neg    pH, UA 5.0 5.0 - 8.0   Protein, UA Negative Negative   Urobilinogen, UA negative (A) 0.2 or 1.0 E.U./dL   Nitrite, UA neg    Leukocytes, UA Small (1+) (A) Negative   Appearance clear    Odor      BMET No results for input(s): NA, K, CL, CO2, GLUCOSE, BUN, CREATININE, CALCIUM in the last 72 hours. PSA No results found for: PSA No results found for: TESTOSTERONE    Studies/Results: No results found.    Assessment & Plan: 1. History of bladder cancer.   He will need weekly BCG x 3 and then f/u in 6 months for cystoscopy.  Cytology today.  2. BPH with BOO and mild LUTS.   He needs no treatment at this time.  3. ED.  He has never filled the sildenafil.    No orders of the defined types were placed in this encounter.    Orders Placed This Encounter  Procedures  . POCT urinalysis dipstick      Return today (on 02/13/2019) for He needs BCG weekly for 3 weeks and then f/u in 6 months for cystoscopy.  .   CC: Doree Albee, MD      [name] 02/13/2019

## 2019-02-16 LAB — CYTOLOGY - NON PAP

## 2019-02-17 ENCOUNTER — Telehealth: Payer: Self-pay

## 2019-02-17 NOTE — Telephone Encounter (Signed)
-----   Message from [name], MD sent at 02/17/2019  9:49 AM EST ----- Negative cytology ----- Message ----- From: Dorisann Frames, RN Sent: 02/17/2019   8:21 AM EST To: [name], MD  Please review

## 2019-02-17 NOTE — Telephone Encounter (Signed)
Sent via mychart

## 2019-02-20 ENCOUNTER — Encounter (INDEPENDENT_AMBULATORY_CARE_PROVIDER_SITE_OTHER): Payer: 59 | Admitting: Nurse Practitioner

## 2019-02-20 ENCOUNTER — Ambulatory Visit (INDEPENDENT_AMBULATORY_CARE_PROVIDER_SITE_OTHER): Payer: 59

## 2019-02-20 ENCOUNTER — Other Ambulatory Visit: Payer: Self-pay

## 2019-02-20 VITALS — Temp 97.3°F

## 2019-02-20 DIAGNOSIS — Z8551 Personal history of malignant neoplasm of bladder: Secondary | ICD-10-CM

## 2019-02-20 LAB — POCT URINALYSIS DIPSTICK
Bilirubin, UA: NEGATIVE
Blood, UA: NEGATIVE
Glucose, UA: NEGATIVE
Ketones, UA: NEGATIVE
Leukocytes, UA: NEGATIVE
Nitrite, UA: NEGATIVE
Protein, UA: NEGATIVE
Spec Grav, UA: 1.02 (ref 1.010–1.025)
Urobilinogen, UA: NEGATIVE E.U./dL — AB
pH, UA: 5 (ref 5.0–8.0)

## 2019-02-20 MED ORDER — BCG LIVE 50 MG IS SUSR
3.2400 mL | Freq: Once | INTRAVESICAL | Status: AC
  Administered 2019-02-20: 16:00:00 81 mg via INTRAVESICAL

## 2019-02-20 NOTE — Progress Notes (Signed)
  This encounter was created in error - please disregard.  This patient was scheduled for a telemedicine visit this morning, however when I called his wife answered and told me that he was actually unavailable for his appointment.  He was rescheduled for an in person appointment in March.

## 2019-02-20 NOTE — Progress Notes (Signed)
BCG Bladder Instillation  BCG # 1 of 3  Due to Bladder Cancer patient is present today for a BCG treatment. Patient was cleaned and prepped in a sterile fashion with betadine. A 14 FR catheter was inserted, urine return was noted 10 ml, urine was yellow in color.  94ml of reconstituted BCG was instilled into the bladder. The catheter was then removed. Patient tolerated well, no complications were noted  Preformed by: H. Tavares Levinson LPN  Follow up/ Additional notes: F/U in 1 wk for bcg 2 of 3

## 2019-02-23 ENCOUNTER — Ambulatory Visit (INDEPENDENT_AMBULATORY_CARE_PROVIDER_SITE_OTHER): Payer: 59 | Admitting: Internal Medicine

## 2019-02-24 ENCOUNTER — Ambulatory Visit (INDEPENDENT_AMBULATORY_CARE_PROVIDER_SITE_OTHER): Payer: 59 | Admitting: Nurse Practitioner

## 2019-02-24 MED FILL — TAMSULOSIN HCL 0.4 MG CAP: 0.4 | 30 days supply | Qty: 30 | Fill #2

## 2019-02-27 ENCOUNTER — Ambulatory Visit (INDEPENDENT_AMBULATORY_CARE_PROVIDER_SITE_OTHER): Payer: 59

## 2019-02-27 ENCOUNTER — Other Ambulatory Visit: Payer: Self-pay

## 2019-02-27 DIAGNOSIS — Z8551 Personal history of malignant neoplasm of bladder: Secondary | ICD-10-CM

## 2019-02-27 LAB — POCT URINALYSIS DIPSTICK
Bilirubin, UA: NEGATIVE
Blood, UA: NEGATIVE
Glucose, UA: NEGATIVE
Ketones, UA: NEGATIVE
Nitrite, UA: NEGATIVE
Protein, UA: NEGATIVE
Spec Grav, UA: >=1.03 — AB (ref 1.010–1.025)
Urobilinogen, UA: NEGATIVE E.U./dL — AB
pH, UA: 5 (ref 5.0–8.0)

## 2019-02-27 MED ORDER — BCG LIVE 50 MG IS SUSR
3.2400 mL | Freq: Once | INTRAVESICAL | Status: AC
  Administered 2019-02-27: 81 mg via INTRAVESICAL

## 2019-02-27 NOTE — Progress Notes (Signed)
BCG Bladder Instillation  BCG #  2 of 3  Due to Bladder Cancer patient is present today for a BCG treatment. Patient was cleaned and prepped in a sterile fashion with betadine. A 16FR catheter was inserted, urine return was noted 56ml, urine was yellow in color.  28ml of reconstituted BCG was instilled into the bladder. The catheter was then removed. Patient tolerated well, no complications were noted  Preformed by: d Devi Hopman,lpn  Follow up/ Additional notes: 1 wk next BCG

## 2019-03-05 ENCOUNTER — Ambulatory Visit (INDEPENDENT_AMBULATORY_CARE_PROVIDER_SITE_OTHER): Payer: 59 | Admitting: Nurse Practitioner

## 2019-03-05 ENCOUNTER — Other Ambulatory Visit: Payer: Self-pay

## 2019-03-05 ENCOUNTER — Encounter (INDEPENDENT_AMBULATORY_CARE_PROVIDER_SITE_OTHER): Payer: Self-pay | Admitting: Nurse Practitioner

## 2019-03-05 ENCOUNTER — Encounter (HOSPITAL_COMMUNITY): Payer: Self-pay

## 2019-03-05 ENCOUNTER — Ambulatory Visit (HOSPITAL_COMMUNITY)
Admission: RE | Admit: 2019-03-05 | Discharge: 2019-03-05 | Disposition: A | Discharge status: Home / Self Care | Payer: 59 | Source: Ambulatory Visit | Attending: Nurse Practitioner | Admitting: Nurse Practitioner

## 2019-03-05 VITALS — BP 110/70 | HR 79 | Temp 97.6°F | Ht 70.0 in | Wt 195.4 lb

## 2019-03-05 DIAGNOSIS — Z1329 Encounter for screening for other suspected endocrine disorder: Secondary | ICD-10-CM

## 2019-03-05 DIAGNOSIS — E7800 Pure hypercholesterolemia, unspecified: Secondary | ICD-10-CM

## 2019-03-05 DIAGNOSIS — F3341 Major depressive disorder, recurrent, in partial remission: Secondary | ICD-10-CM

## 2019-03-05 DIAGNOSIS — Z125 Encounter for screening for malignant neoplasm of prostate: Secondary | ICD-10-CM | POA: Diagnosis not present

## 2019-03-05 DIAGNOSIS — R7303 Prediabetes: Secondary | ICD-10-CM

## 2019-03-05 DIAGNOSIS — M25532 Pain in left wrist: Secondary | ICD-10-CM

## 2019-03-05 DIAGNOSIS — Z1159 Encounter for screening for other viral diseases: Secondary | ICD-10-CM

## 2019-03-05 DIAGNOSIS — Z139 Encounter for screening, unspecified: Secondary | ICD-10-CM

## 2019-03-05 DIAGNOSIS — Z0001 Encounter for general adult medical examination with abnormal findings: Secondary | ICD-10-CM | POA: Diagnosis not present

## 2019-03-05 NOTE — Progress Notes (Signed)
Subjective:  Patient ID: TRES GRZYWACZ, male    DOB: [birth year]  Age: 59 y.o. MRN: [id number]  CC:  Chief Complaint  Patient presents with  . prediabetes  . Hyperlipidemia  . Follow-up    Depression, left wrist pain, cough, vitamin D deficiency      HPI  This patient comes in today for the above.  Prediabetes/hyperlipidemia: Patient has a history of prediabetes and hyperlipidemia.  He continues on daily baby aspirin, niacin, and omega-3.  He is not on statin therapy.  Last A1c was collected in December 2019 and it was 5.7.  Last the panel was collected at this office in March 2020.  Total cholesterol was 269, HDL was 39, LDL was 196, and triglycerides were 170.  He has been encouraged to try a plant-based diet in the past, but tells me this is not realistic for him.  He does try to eat lean cuts of meat and vegetables.   Depression: He continues on Lexapro 5 mg daily.  He tells me is tolerating this well.  Left wrist pain: He tells me a little bit over 6 months ago he was at work and was performing tasks that involved repetitive pressure on his wrist, which resulted in pain and tenderness to his left wrist.  He thought the pain would subside on its own, but it persists.  He tells me his wrist hurts with supination and is tender to touch where his radius meets his carpals.  He has been wearing a brace which which seems to help in controlling his pain.  Pain does not occur unless he is moving his wrist supine.  He denies any numbness or tingling, or weakness.  Cough: I did a virtual office visit with him approximately 1 month ago.  At that time he complained of cough.  He was tested for Covid and results came back negative.  He tells me today that he does have a lingering cough at times but overall is feeling much better.  He denies any shortness of breath, fever, or fatigue.     Past Medical History:  Diagnosis Date  . 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 Arthritis      bilateral fingers  . Asthma    as child, no longer problematic  . Bladder cancer [facility])    Status post surgery and immunotherapy 2018  . Depression   . Fatty liver   . Hypercholesteremia   . Irritable bowel syndrome   . Nephrolithiasis   . Prediabetes   . Throat swelling    from tick  . Tongue swelling    from tick  . Urticaria    from tick  . Varicella zoster       Family History  Problem Relation Age of Onset  . Pancreatic cancer Brother   . Alcoholism Mother   . Cirrhosis Mother   . Alcoholism Father   . Broken bones Father   . Stroke Maternal Grandmother   . Cancer Maternal Grandfather   . Diabetes Maternal Grandfather   . Emphysema Paternal Grandfather   . Allergic rhinitis Neg Hx   . Angioedema Neg Hx   . Asthma Neg Hx   . Atopy Neg Hx   . Eczema Neg Hx   . Immunodeficiency Neg Hx   . 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   Social  History   Tobacco Use  . Smoking status: Never Smoker  . Smokeless tobacco: Never Used  Substance Use Topics  . Alcohol use: Yes    Alcohol/week: 2.0 standard drinks    Types: 2 Cans of beer per week    Comment: per month     Current Meds  Medication Sig  . aspirin 81 MG chewable tablet Chew 81 mg by mouth daily.   . Cetirizine HCl (ZYRTEC ALLERGY) 10 MG CAPS Take by mouth 2 (two) times daily.   . Cholecalciferol (VITAMIN D-3) 125 MCG (5000 UT) TABS Take by mouth.  . escitalopram (LEXAPRO) 5 MG tablet Take 1 tablet (5 mg total) by mouth daily.  . Multiple Vitamin (MULTIVITAMIN) capsule Take by mouth daily.   . niacin (NIASPAN) 1000 MG CR tablet Take 1,500 mg by mouth 2 (two) times daily.   Marland Kitchen omega-3 acid ethyl esters (LOVAZA) 1 g capsule Take 2 g by mouth 2 (two) times daily.   . tamsulosin (FLOMAX) 0.4 MG CAPS capsule Take 0.4 mg by mouth.    ROS:  Review of Systems  Constitutional: Negative for fever, malaise/fatigue and  weight loss.  Eyes: Negative for blurred vision and double vision.  Respiratory: Negative for cough, shortness of breath and wheezing.   Cardiovascular: Negative for chest pain and palpitations.  Genitourinary: Negative for dysuria and hematuria.  Musculoskeletal: Positive for joint pain (left wrist).  Neurological: Negative for dizziness and headaches.     Objective:   Today's Vitals: BP 110/70 (BP Location: Left Arm, Patient Position: Sitting, Cuff Size: Normal)   Pulse 79   Temp 97.6 F (36.4 C) (Temporal)   Ht '5\' 10"'  (1.778 m)   Wt 195 lb 6.4 oz (88.6 kg)   SpO2 95%   BMI 28.04 kg/m  Vitals with BMI 03/05/2019 02/13/2019 02/04/2019  Height '5\' 10"'  '5\' 11"'  -  Weight 195 lbs 6 oz 195 lbs -  BMI 81.15 72.62 -  Systolic 035 597 (No Data)  Diastolic 70 81 (No Data)  Pulse 79 73 -     Physical Exam Vitals reviewed.  Constitutional:      Appearance: Normal appearance.  HENT:     Head: Normocephalic and atraumatic.  Cardiovascular:     Rate and Rhythm: Normal rate and regular rhythm.  Pulmonary:     Effort: Pulmonary effort is normal.     Breath sounds: Normal breath sounds.  Musculoskeletal:     Right wrist: Normal. No swelling, deformity, bony tenderness or snuff box tenderness. Normal range of motion. Normal pulse.     Left wrist: Deformity (prominant radius where radius meets his carpals) and bony tenderness present. No swelling or snuff box tenderness. Normal range of motion (pain with supination). Normal pulse.     Cervical back: Neck supple.     Comments: No thenar muscle wasting noted  Skin:    General: Skin is warm and dry.  Neurological:     Mental Status: He is alert and oriented to person, place, and time.     Motor: Motor function is intact. No weakness.  Psychiatric:        Mood and Affect: Mood normal.        Behavior: Behavior normal.        Thought Content: Thought content normal.        Judgment: Judgment normal.          Assessment   1. Left  wrist pain   2. Screening for condition   3.  Encounter for general adult medical examination with abnormal findings   4. Encounter for hepatitis C screening test for low risk patient   5. Prostate cancer screening   6. Screening for thyroid disorder   7. Hypercholesteremia   8. Prediabetes   9. Recurrent major depressive disorder, in partial remission ([place])     Tests ordered Orders Placed This Encounter  Procedures  . DG Wrist Complete Left  . CBC  . CMP with eGFR([facility])  . Lipid Panel  . Hemoglobin A1c  . TSH  . Hep C Antibody  . PSA  . Vitamin D, 25-hydroxy     Plan: 1.  I will send him for x-ray for further evaluation.  I encouraged him to wear his wrist brace until we receive results of x-ray.  2.,  3.,  4.,  5.,  6.,  7.,  8.,  We will collect blood work for further evaluation today.  He will follow-up in approximately 3 weeks for annual physical exam to discuss results.  9.  Mood appears stable on current medication regimen.  Will not recommend changes as of right now.  No orders of the defined types were placed in this encounter.   Patient to follow-up in 3 weeks for annual physical exam.  Ailene Ards, NP

## 2019-03-06 ENCOUNTER — Ambulatory Visit (INDEPENDENT_AMBULATORY_CARE_PROVIDER_SITE_OTHER): Payer: 59

## 2019-03-06 ENCOUNTER — Telehealth (INDEPENDENT_AMBULATORY_CARE_PROVIDER_SITE_OTHER): Payer: Self-pay | Admitting: Nurse Practitioner

## 2019-03-06 DIAGNOSIS — Z8551 Personal history of malignant neoplasm of bladder: Secondary | ICD-10-CM

## 2019-03-06 DIAGNOSIS — M25532 Pain in left wrist: Secondary | ICD-10-CM

## 2019-03-06 LAB — POCT URINALYSIS DIPSTICK
Bilirubin, UA: NEGATIVE
Blood, UA: NEGATIVE
Glucose, UA: NEGATIVE
Ketones, UA: NEGATIVE
Leukocytes, UA: NEGATIVE
Nitrite, UA: NEGATIVE
Protein, UA: NEGATIVE
Spec Grav, UA: >=1.03 — AB (ref 1.010–1.025)
Urobilinogen, UA: 0.2 E.U./dL
pH, UA: 5 (ref 5.0–8.0)

## 2019-03-06 MED ORDER — BCG LIVE 50 MG IS SUSR
3.2400 mL | Freq: Once | INTRAVESICAL | Status: AC
  Administered 2019-03-06: 14:00:00 81 mg via INTRAVESICAL

## 2019-03-06 NOTE — Progress Notes (Signed)
BCG Bladder Instillation  BCG # 3 of 3  Due to Bladder Cancer patient is present today for a BCG treatment. Patient was cleaned and prepped in a sterile fashion with betadine. A 14 FR catheter was inserted, urine return was noted 20 ml, urine was yellow in color.  38ml of reconstituted BCG was instilled into the bladder. The catheter was then removed. Patient tolerated well, no complications were noted  Preformed by: H. Joanmarie Tsang LPN  Follow up/ Additional notes:  3 month  with CYSTO

## 2019-03-06 NOTE — Telephone Encounter (Signed)
X-ray of patient's left wrist showed no abnormalities.  I did consult with Dr. Anastasio Champion about referral to specialist for further evaluation.  He recommends referral to orthopedics.  I did try to call this patient to discuss this, however he did not answer the phone.  I was able to get in touch with his wife and I explained the results.  I recommended at this point that he be evaluated by orthopedist.  She is agreeable to this and requested that referral be sent for evaluation with Dr. Jeannie Fend at [facility] they are familiar with this provider.  I will place the referral.  I told the wife that the patient should try to wear his wrist brace on a regular basis while waiting to see the orthopedist, she tells me she understands.

## 2019-03-10 ENCOUNTER — Encounter: Payer: Self-pay | Admitting: Diagnostic Neuroimaging

## 2019-03-10 ENCOUNTER — Ambulatory Visit (INDEPENDENT_AMBULATORY_CARE_PROVIDER_SITE_OTHER): Payer: 59 | Admitting: Diagnostic Neuroimaging

## 2019-03-10 ENCOUNTER — Other Ambulatory Visit: Payer: Self-pay

## 2019-03-10 VITALS — BP 124/72 | HR 67 | Temp 97.7°F | Ht 71.0 in | Wt 199.0 lb

## 2019-03-10 DIAGNOSIS — R200 Anesthesia of skin: Secondary | ICD-10-CM | POA: Diagnosis not present

## 2019-03-10 DIAGNOSIS — R202 Paresthesia of skin: Secondary | ICD-10-CM

## 2019-03-10 NOTE — Patient Instructions (Signed)
-   check EMG testing

## 2019-03-10 NOTE — Progress Notes (Signed)
[facility]  PATIENT: Lee Hughes DOB: Oct 29, 1960  REFERRING CLINICIAN: Doree Albee, MD HISTORY FROM: patient  REASON FOR VISIT: new consult    HISTORICAL  CHIEF COMPLAINT:  Chief Complaint  Patient presents with  . Numbness/tingling right hand    rm 7, New Pt  "first three fingers in right hand, started in last 2 years with numbness in both hands, saw chiropractor and left hand got better ; no history of injury"    HISTORY OF PRESENT ILLNESS:   59 year old male here for evaluation of right hand numbness.  For past 2 years patient has had numbness in digits 1-3 on the right side.  He has had some mild numbness to the left side.  No weakness.  No neck pain.  No problems with legs or face.  No other triggering aggravating factors.  Has noticed some left wrist pain.   REVIEW OF SYSTEMS: Full 14 system review of systems performed and negative with exception of: As per HPI.  ALLERGIES: Allergies  Allergen Reactions  . No Known Allergies   . Other     No beef, pork, deer, products secondary from tick bite    HOME MEDICATIONS: Outpatient Medications Prior to Visit  Medication Sig Dispense Refill  . aspirin 81 MG chewable tablet Chew 81 mg by mouth daily.     . Cetirizine HCl (ZYRTEC ALLERGY) 10 MG CAPS Take by mouth 2 (two) times daily.     . Cholecalciferol (VITAMIN D-3) 125 MCG (5000 UT) TABS Take by mouth.    . escitalopram (LEXAPRO) 5 MG tablet Take 1 tablet (5 mg total) by mouth daily. 90 tablet 1  . Multiple Vitamin (MULTIVITAMIN) capsule Take by mouth daily.     . niacin (NIASPAN) 1000 MG CR tablet Take 1,500 mg by mouth 2 (two) times daily.     Marland Kitchen omega-3 acid ethyl esters (LOVAZA) 1 g capsule Take 2 g by mouth 2 (two) times daily.     . tamsulosin (FLOMAX) 0.4 MG CAPS capsule Take 0.4 mg by mouth.     No facility-administered medications prior to visit.    PAST MEDICAL HISTORY: Past Medical History:  Diagnosis Date  . 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 Arthritis    bilateral fingers  . Asthma    as child, no longer problematic  . Bladder cancer [facility])    Status post surgery and immunotherapy 2018  . Depression   . Fatty liver   . Hypercholesteremia   . Irritable bowel syndrome   . Nephrolithiasis   . Prediabetes   . Throat swelling    from tick  . Tongue swelling    from tick  . Urticaria    from tick  . Varicella zoster     PAST SURGICAL HISTORY: Past Surgical History:  Procedure Laterality Date  . COLONOSCOPY N/A 09/09/2012   Procedure: COLONOSCOPY;  Surgeon: Jamesetta So, MD;  Location[facility];  Service: Gastroenterology;  Laterality: N/A;  . Left femur Left 1988  . TRANSURETHRAL RESECTION OF BLADDER TUMOR N/A 12/18/2016   Procedure: CYSTOSCOPY TRANSURETHRAL RESECTION OF BLADDER TUMOR (TURBT);  Surgeon: [name], MD;  Location: [facility];  Service: Urology;  Laterality: N/A;    FAMILY HISTORY: Family History  Problem Relation Age of Onset  . Pancreatic cancer Brother   . Alcoholism Mother   . Cirrhosis Mother   . Alcoholism Father   . Broken bones Father   .  Stroke Maternal Grandmother   . Cancer Maternal Grandfather   . Diabetes Maternal Grandfather   . Emphysema Paternal Grandfather   . Dementia Paternal Aunt   . Dementia Paternal Uncle   . Allergic rhinitis Neg Hx   . Angioedema Neg Hx   . Asthma Neg Hx   . Atopy Neg Hx   . Eczema Neg Hx   . Immunodeficiency Neg Hx   . Urticaria Neg Hx     SOCIAL HISTORY: Social History   Socioeconomic History  . Marital status: Married    Spouse name: [name]  . Number of children: Not on file  . Years of education: Not on file  . Highest education level: Associate degree: academic program  Occupational History  . Not on file  Tobacco Use  . Smoking status: Never Smoker  . Smokeless tobacco: Never Used  Substance and Sexual Activity  . Alcohol use: Yes    Alcohol/week: 2.0 standard drinks     Types: 2 Cans of beer per week    Comment: occas  . Drug use: Never  . Sexual activity: Not Currently  Other Topics Concern  . Not on file  Social History Narrative   Married 18 years. Lives with wife. 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   Caffeine- coffee 6 c daily   Social Determinants of Health   Financial Resource Strain:   . Difficulty of Paying Living Expenses: Not on file  Food Insecurity:   . Worried About Charity fundraiser in the Last Year: Not on file  . Ran Out of Food in the Last Year: Not on file  Transportation Needs:   . Lack of Transportation (Medical): Not on file  . Lack of Transportation (Non-Medical): Not on file  Physical Activity:   . Days of Exercise per Week: Not on file  . Minutes of Exercise per Session: Not on file  Stress:   . Feeling of Stress : Not on file  Social Connections:   . Frequency of Communication with Friends and Family: Not on file  . Frequency of Social Gatherings with Friends and Family: Not on file  . Attends Religious Services: Not on file  . Active Member of Clubs or Organizations: Not on file  . Attends Archivist Meetings: Not on file  . Marital Status: Not on file  Intimate Partner Violence:   . Fear of Current or Ex-Partner: Not on file  . Emotionally Abused: Not on file  . Physically Abused: Not on file  . Sexually Abused: Not on file     PHYSICAL EXAM  GENERAL EXAM/CONSTITUTIONAL: Vitals:  Vitals:   03/10/19 1529  BP: 124/72  Pulse: 67  Temp: 97.7 F (36.5 C)  Weight: 199 lb (90.3 kg)  Height: 5\' 11"  (1.803 m)     Body mass index is 27.75 kg/m. Wt Readings from Last 3 Encounters:  03/10/19 199 lb (90.3 kg)  03/05/19 195 lb 6.4 oz (88.6 kg)  02/13/19 195 lb (88.5 kg)     Patient is in no distress; well developed, nourished and groomed; neck is supple  CARDIOVASCULAR:  Examination of carotid arteries is normal; no carotid bruits  Regular rate and rhythm, no murmurs  Examination of peripheral  vascular system by observation and palpation is normal  EYES:  Ophthalmoscopic exam of optic discs and posterior segments is normal; no papilledema or hemorrhages  No exam data present  MUSCULOSKELETAL:  Gait, strength, tone, movements noted in Neurologic exam below  NEUROLOGIC: MENTAL STATUS:  No flowsheet data found.  awake, alert, oriented to person, place and time  recent and remote memory intact  normal attention and concentration  language fluent, comprehension intact, naming intact  fund of knowledge appropriate  CRANIAL NERVE:   2nd - no papilledema on fundoscopic exam  2nd, 3rd, 4th, 6th - pupils equal and reactive to light, visual fields full to confrontation, extraocular muscles intact, no nystagmus  5th - facial sensation symmetric  7th - facial strength symmetric  8th - hearing intact  9th - palate elevates symmetrically, uvula midline  11th - shoulder shrug symmetric  12th - tongue protrusion midline  MOTOR:   normal bulk and tone, full strength in the BUE, BLE  EXCEPT SLIGHT ATROPHY OF RIGHT APB  SENSORY:   normal and symmetric to light touch, pinprick  PHALENS NEG  COORDINATION:   finger-nose-finger, fine finger movements normal  REFLEXES:   deep tendon reflexes TRACE and symmetric  GAIT/STATION:   narrow based gait     DIAGNOSTIC DATA (LABS, IMAGING, TESTING) - I reviewed patient records, labs, notes, testing and imaging myself where available.  Lab Results  Component Value Date   WBC 9.0 08/14/2018   HGB 14.6 08/14/2018   HCT 44.8 08/14/2018   MCV 78.5 (L) 08/14/2018   PLT 223 08/14/2018      Component Value Date/Time   NA 137 08/14/2018 1742   NA 141 04/30/2017 0923   K 3.9 08/14/2018 1742   CL 104 08/14/2018 1742   CO2 22 08/14/2018 1742   GLUCOSE 104 (H) 08/14/2018 1742   BUN 14 08/14/2018 1742   BUN 15 04/30/2017 0923   CREATININE 0.88 08/14/2018 1742   CALCIUM 9.3 08/14/2018 1742   PROT 6.8 08/14/2018  1742   PROT 7.1 04/30/2017 0923   ALBUMIN 3.9 08/14/2018 1742   ALBUMIN 4.3 04/30/2017 0923   AST 25 08/14/2018 1742   ALT 24 08/14/2018 1742   ALKPHOS 81 08/14/2018 1742   BILITOT 0.8 08/14/2018 1742   BILITOT 0.2 04/30/2017 0923   GFRNONAA >60 08/14/2018 1742   GFRAA >60 08/14/2018 1742   Lab Results  Component Value Date   CHOL 166 04/30/2017   HDL 39 (L) 04/30/2017   LDLCALC 91 04/30/2017   TRIG 182 (H) 04/30/2017   Lab Results  Component Value Date   HGBA1C 5.6 05/24/2016   No results found for: VITAMINB12 No results found for: TSH   03/05/19 xray left wrist [I reviewed images myself and agree with interpretation. -VRP]  - negative  08/14/18 xray right hand [I reviewed images myself and agree with interpretation; except should say metacarpophalangeal joint. -VRP]  - Focal arthrosis at the first metatarsophalangeal joint with mild adjacent soft tissue swelling. No acute osseous abnormality.   ASSESSMENT AND PLAN  59 y.o. year old male here with:  Dx:  1. Numbness and tingling in right hand     PLAN:  - check EMG/NCS  Orders Placed This Encounter  Procedures  . NCV with EMG(electromyography)   Return for for NCV/EMG.    Penni Bombard, MD 0000000, [id number] PM Certified in Neurology, Neurophysiology and Neuroimaging  [facility] [address], [place], [place] [address] [phone]

## 2019-03-19 ENCOUNTER — Other Ambulatory Visit (INDEPENDENT_AMBULATORY_CARE_PROVIDER_SITE_OTHER): Payer: 59

## 2019-03-19 ENCOUNTER — Other Ambulatory Visit: Payer: Self-pay

## 2019-03-19 DIAGNOSIS — R7303 Prediabetes: Secondary | ICD-10-CM | POA: Diagnosis not present

## 2019-03-19 DIAGNOSIS — Z125 Encounter for screening for malignant neoplasm of prostate: Secondary | ICD-10-CM | POA: Diagnosis not present

## 2019-03-19 DIAGNOSIS — Z1329 Encounter for screening for other suspected endocrine disorder: Secondary | ICD-10-CM | POA: Diagnosis not present

## 2019-03-19 DIAGNOSIS — Z1159 Encounter for screening for other viral diseases: Secondary | ICD-10-CM | POA: Diagnosis not present

## 2019-03-19 DIAGNOSIS — M25532 Pain in left wrist: Secondary | ICD-10-CM | POA: Diagnosis not present

## 2019-03-19 DIAGNOSIS — Z0001 Encounter for general adult medical examination with abnormal findings: Secondary | ICD-10-CM | POA: Diagnosis not present

## 2019-03-19 DIAGNOSIS — Z139 Encounter for screening, unspecified: Secondary | ICD-10-CM | POA: Diagnosis not present

## 2019-03-19 DIAGNOSIS — E7800 Pure hypercholesterolemia, unspecified: Secondary | ICD-10-CM | POA: Diagnosis not present

## 2019-03-19 DIAGNOSIS — F3341 Major depressive disorder, recurrent, in partial remission: Secondary | ICD-10-CM | POA: Diagnosis not present

## 2019-03-20 ENCOUNTER — Telehealth (INDEPENDENT_AMBULATORY_CARE_PROVIDER_SITE_OTHER): Payer: Self-pay | Admitting: Nurse Practitioner

## 2019-03-20 DIAGNOSIS — M25532 Pain in left wrist: Secondary | ICD-10-CM | POA: Diagnosis not present

## 2019-03-20 DIAGNOSIS — M654 Radial styloid tenosynovitis [de Quervain]: Secondary | ICD-10-CM | POA: Diagnosis not present

## 2019-03-20 DIAGNOSIS — G5601 Carpal tunnel syndrome, right upper limb: Secondary | ICD-10-CM | POA: Diagnosis not present

## 2019-03-20 LAB — LIPID PANEL
Cholesterol: 245 mg/dL — ABNORMAL HIGH (ref <200)
HDL: 40 mg/dL (ref >=40)
LDL Cholesterol (Calc): 164 mg/dL (calc) — ABNORMAL HIGH
Non-HDL Cholesterol (Calc): 205 mg/dL (calc) — ABNORMAL HIGH (ref <130)
Total CHOL/HDL Ratio: 6.1 (calc) — ABNORMAL HIGH (ref <5.0)
Triglycerides: 250 mg/dL — ABNORMAL HIGH (ref <150)

## 2019-03-20 LAB — COMPLETE METABOLIC PANEL WITH GFR
AG Ratio: 1.6 (calc) (ref 1.0–2.5)
ALT: 21 U/L (ref 9–46)
AST: 24 U/L (ref 10–35)
Albumin: 4.1 g/dL (ref 3.6–5.1)
Alkaline phosphatase (APISO): 82 U/L (ref 35–144)
BUN: 21 mg/dL (ref 7–25)
CO2: 29 mmol/L (ref 20–32)
Calcium: 9.5 mg/dL (ref 8.6–10.3)
Chloride: 104 mmol/L (ref 98–110)
Creat: 0.96 mg/dL (ref 0.70–1.33)
GFR, Est African American: 102 mL/min/{1.73_m2} (ref >=60)
GFR, Est Non African American: 88 mL/min/{1.73_m2} (ref >=60)
Globulin: 2.5 g/dL (calc) (ref 1.9–3.7)
Glucose, Bld: 87 mg/dL (ref 65–99)
Potassium: 4.4 mmol/L (ref 3.5–5.3)
Sodium: 139 mmol/L (ref 135–146)
Total Bilirubin: 0.4 mg/dL (ref 0.2–1.2)
Total Protein: 6.6 g/dL (ref 6.1–8.1)

## 2019-03-20 LAB — HEMOGLOBIN A1C
Hgb A1c MFr Bld: 5.4 % of total Hgb (ref <5.7)
Mean Plasma Glucose: 108 (calc)
eAG (mmol/L): 6 (calc)

## 2019-03-20 LAB — PSA: PSA: 4.1 ng/mL — ABNORMAL HIGH (ref <=4.0)

## 2019-03-20 LAB — CBC
HCT: 45.5 % (ref 38.5–50.0)
Hemoglobin: 14.6 g/dL (ref 13.2–17.1)
MCH: 25.9 pg — ABNORMAL LOW (ref 27.0–33.0)
MCHC: 32.1 g/dL (ref 32.0–36.0)
MCV: 80.8 fL (ref 80.0–100.0)
MPV: 10.4 fL (ref 7.5–12.5)
Platelets: 250 10*3/uL (ref 140–400)
RBC: 5.63 10*6/uL (ref 4.20–5.80)
RDW: 13.9 % (ref 11.0–15.0)
WBC: 5.3 10*3/uL (ref 3.8–10.8)

## 2019-03-20 LAB — TSH: TSH: 1.4 mIU/L (ref 0.40–4.50)

## 2019-03-20 LAB — HEPATITIS C ANTIBODY
Hepatitis C Ab: NONREACTIVE
SIGNAL TO CUT-OFF: 0.01 (ref <1.00)

## 2019-03-20 LAB — VITAMIN D 25 HYDROXY (VIT D DEFICIENCY, FRACTURES): Vit D, 25-Hydroxy: 41 ng/mL (ref 30–100)

## 2019-03-20 NOTE — Telephone Encounter (Signed)
I attempted to call this patient regarding his abnormal PSA level from his most recent blood work.  He does have a history of bladder cancer and BPH.  He does follow-up with urology (Dr. Jeffie Pollock with [facility]).  He did see Dr. Jeffie Pollock just last month.  I have sent a message to Dr. Jeffie Pollock asking if he would recommend a complete further testing initially including total and free percentage of PSA and complete digital rectal exam, or if Dr. Jeffie Pollock would like me to just refer the patient to him now in light of the patient's history of bladder cancer.  I am awaiting to hear back from Dr. Jeffie Pollock.  I was unable to get a hold of the patient, however the patient has signed a DPR allowing me to leave a detailed message on his voicemail.  I did leave a detailed message of this on his voicemail.  He is scheduled to follow-up in our office in a couple of weeks with Dr. Anastasio Champion.  I will hopefully hear from Dr. Jeffie Pollock by then, and will relay these recommendations to Dr. Anastasio Champion so he can perform follow-up tests as recommended.

## 2019-03-22 DIAGNOSIS — G5601 Carpal tunnel syndrome, right upper limb: Secondary | ICD-10-CM | POA: Insufficient documentation

## 2019-03-23 ENCOUNTER — Other Ambulatory Visit: Payer: Self-pay

## 2019-03-23 ENCOUNTER — Other Ambulatory Visit (INDEPENDENT_AMBULATORY_CARE_PROVIDER_SITE_OTHER): Payer: Self-pay | Admitting: Nurse Practitioner

## 2019-03-23 DIAGNOSIS — R972 Elevated prostate specific antigen [PSA]: Secondary | ICD-10-CM

## 2019-03-23 NOTE — Progress Notes (Signed)
I did discuss the elevated PSA level with this patient's urologist.  His recommendation was to repeat labs in approximately 2 to 3 months (PSA total and free), and then have the patient follow-up with Dr. Jeffie Pollock (patient's urologist) for further evaluation.  Orders placed.

## 2019-03-27 MED FILL — TAMSULOSIN HCL 0.4 MG CAP: 0.4 | 30 days supply | Qty: 30 | Fill #3

## 2019-03-31 ENCOUNTER — Encounter (INDEPENDENT_AMBULATORY_CARE_PROVIDER_SITE_OTHER): Payer: Self-pay | Admitting: Internal Medicine

## 2019-03-31 ENCOUNTER — Other Ambulatory Visit: Payer: Self-pay

## 2019-03-31 ENCOUNTER — Ambulatory Visit (INDEPENDENT_AMBULATORY_CARE_PROVIDER_SITE_OTHER): Payer: 59 | Admitting: Internal Medicine

## 2019-03-31 VITALS — BP 115/65 | HR 58 | Temp 97.4°F | Ht 71.0 in | Wt 194.4 lb

## 2019-03-31 DIAGNOSIS — E7800 Pure hypercholesterolemia, unspecified: Secondary | ICD-10-CM | POA: Diagnosis not present

## 2019-03-31 DIAGNOSIS — R972 Elevated prostate specific antigen [PSA]: Secondary | ICD-10-CM

## 2019-03-31 NOTE — Patient Instructions (Signed)
[facility] Dietary Recommendations for Weight Loss What to Avoid . Avoid added sugars o Often added sugar can be found in processed foods such as many condiments, dry cereals, cakes, cookies, chips, crisps, crackers, candies, sweetened drinks, etc.  o Read labels and AVOID/DECREASE use of foods with the following in their ingredient list: Sugar, fructose, high fructose corn syrup, sucrose, glucose, maltose, dextrose, molasses, cane sugar, brown sugar, any type of syrup, agave nectar, etc.   . Avoid snacking in between meals . Avoid foods made with flour o If you are going to eat food made with flour, choose those made with whole-grains; and, minimize your consumption as much as is tolerable . Avoid processed foods o These foods are generally stocked in the middle of the grocery store. Focus on shopping on the perimeter of the grocery.  . Avoid Meat  o We recommend following a plant-based diet at [facility]. Thus, we recommend avoiding meat as a general rule. Consider eating beans, legumes, eggs, and/or dairy products for regular protein sources o If you plan on eating meat limit to 4 ounces of meat at a time and choose lean options such as Fish, chicken, turkey. Avoid red meat intake such as pork and/or steak What to Include . Vegetables o GREEN LEAFY VEGETABLES: Kale, spinach, mustard greens, collard greens, cabbage, broccoli, etc. o OTHER: Asparagus, cauliflower, eggplant, carrots, peas, Brussel sprouts, tomatoes, bell peppers, zucchini, beets, cucumbers, etc. . Grains, seeds, and legumes o Beans: kidney beans, black eyed peas, garbanzo beans, black beans, pinto beans, etc. o Whole, unrefined grains: brown rice, barley, bulgur, oatmeal, etc. . Healthy fats  o Avoid highly processed fats such as vegetable oil o Examples of healthy fats: avocado, olives, virgin olive oil, dark chocolate (?72% Cocoa), nuts (peanuts, almonds, walnuts, cashews, pecans, etc.) . None to Low  Intake of Animal Sources of Protein o Meat sources: chicken, turkey, salmon, tuna. Limit to 4 ounces of meat at one time. o Consider limiting dairy sources, but when choosing dairy focus on: PLAIN Greek yogurt, cottage cheese, high-protein milk . Fruit o Choose berries  When to Eat . Intermittent Fasting: o Choosing not to eat for a specific time period, but DO FOCUS ON HYDRATION when fasting o Multiple Techniques: - Time Restricted Eating: eat 3 meals in a day, each meal lasting no more than 60 minutes, no snacks between meals - 16-18 hour fast: fast for 16 to 18 hours up to 7 days a week. Often suggested to start with 2-3 nonconsecutive days per week.  . Remember the time you sleep is counted as fasting.  . Examples of eating schedule: Fast from 7:00pm-11:00am. Eat between 11:00am-7:00pm.  - 24-hour fast: fast for 24 hours up to every other day. Often suggested to start with 1 day per week . Remember the time you sleep is counted as fasting . Examples of eating schedule:  o Eating day: eat 2-3 meals on your eating day. If doing 2 meals, each meal should last no more than 90 minutes. If doing 3 meals, each meal should last no more than 60 minutes. Finish last meal by 7:00pm. o Fasting day: Fast until 7:00pm.  o IF YOU FEEL UNWELL FOR ANY REASON/IN ANY WAY WHEN FASTING, STOP FASTING BY EATING A NUTRITIOUS SNACK OR LIGHT MEAL o ALWAYS FOCUS ON HYDRATION DURING FASTS - Acceptable Hydration sources: water, broths, tea/coffee (black tea/coffee is best but using a small amount of whole-fat dairy products in coffee/tea is acceptable).  -   Poor Hydration Sources: anything with sugar or artificial sweeteners added to it  These recommendations have been developed for patients that are actively receiving medical care from either Dr. Terrin Imparato or Sarah Gray, DNP, NP-C at [facility]. These recommendations are developed for patients with specific medical conditions and are not meant to be  distributed or used by others that are not actively receiving care from either provider listed above at [facility]. It is not appropriate to participate in the above eating plans without proper medical supervision.   Reference: Fung, J. The obesity code. [place]: [place]; 2016.   

## 2019-03-31 NOTE — Progress Notes (Signed)
Metrics: Intervention Frequency ACO  Documented Smoking Status Yearly  Screened one or more times in 24 months  Cessation Counseling or  Active cessation medication Past 24 months  Past 24 months   Guideline developer: UpToDate (See UpToDate for funding source) Date Released: 2014       Wellness Office Visit  Subjective:  Patient ID: Lee Hughes, male    DOB: 02/17/1960  Age: 59 y.o. MRN: [id number]  CC: This man comes in for follow-up regarding his elevated PSA and hyperlipidemia. HPI  His PSA was 4.1.  His cholesterol actually was better than it was last year.  He denies any urinary symptoms.  He has a history of bladder cancer and follows urology. Past Medical History:  Diagnosis Date  . 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 Arthritis    bilateral fingers  . Asthma    as child, no longer problematic  . Bladder cancer [facility])    Status post surgery and immunotherapy 2018  . Depression   . Fatty liver   . Hypercholesteremia   . Irritable bowel syndrome   . Nephrolithiasis   . Prediabetes   . Throat swelling    from tick  . Tongue swelling    from tick  . Urticaria    from tick  . Varicella zoster       Family History  Problem Relation Age of Onset  . Pancreatic cancer Brother   . Alcoholism Mother   . Cirrhosis Mother   . Alcoholism Father   . Broken bones Father   . Stroke Maternal Grandmother   . Cancer Maternal Grandfather   . Diabetes Maternal Grandfather   . Emphysema Paternal Grandfather   . Dementia Paternal Aunt   . Dementia Paternal Uncle   . Allergic rhinitis Neg Hx   . Angioedema Neg Hx   . Asthma Neg Hx   . Atopy Neg Hx   . Eczema Neg Hx   . Immunodeficiency Neg Hx   . Urticaria Neg Hx     Social History   Social History Narrative   Married 18 years. Lives with wife. 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   Caffeine- coffee 6 c daily   Social History   Tobacco Use  . Smoking status: Never Smoker  . Smokeless tobacco: Never Used    Substance Use Topics  . Alcohol use: Yes    Alcohol/week: 2.0 standard drinks    Types: 2 Cans of beer per week    Comment: occas    Current Meds  Medication Sig  . aspirin 81 MG chewable tablet Chew 81 mg by mouth daily.   . Cetirizine HCl (ZYRTEC ALLERGY) 10 MG CAPS Take by mouth 2 (two) times daily.   . Cholecalciferol (VITAMIN D-3) 125 MCG (5000 UT) TABS Take by mouth.  . escitalopram (LEXAPRO) 5 MG tablet Take 1 tablet (5 mg total) by mouth daily.  . Multiple Vitamin (MULTIVITAMIN) capsule Take by mouth daily.   . niacin (NIASPAN) 1000 MG CR tablet Take 1,500 mg by mouth 2 (two) times daily.   Marland Kitchen omega-3 acid ethyl esters (LOVAZA) 1 g capsule Take 2 g by mouth 2 (two) times daily.   . tamsulosin (FLOMAX) 0.4 MG CAPS capsule Take 0.4 mg by mouth.       Objective:   Today's Vitals: BP 115/65 (BP Location: Left Arm, Patient Position: Sitting, Cuff Size: Normal)   Pulse (!) 58   Temp (!) 97.4 F (36.3 C) (Temporal)  Ht 5\' 11"  (1.803 m)   Wt 194 lb 6.4 oz (88.2 kg)   SpO2 97%   BMI 27.11 kg/m  Vitals with BMI 03/31/2019 03/10/2019 03/05/2019  Height 5\' 11"  5\' 11"  5\' 10"   Weight 194 lbs 6 oz 199 lbs 195 lbs 6 oz  BMI 27.13 [id number]  Systolic [id number]  Diastolic 65 72 70  Pulse 58 67 79     Physical Exam  He looks systemically well.  He has lost 5 pounds since the last visit.  Blood pressure is excellent.     Assessment   1. Elevated PSA   2. Hypercholesteremia       Tests ordered No orders of the defined types were placed in this encounter.    Plan: 1. He will get a total and free percentage  PSA done today. 2. We also discussed nutrition and a plant-based diet with an element of intermittent fasting.  He is trying to do better already. 3. Further recommendations will depend on blood results and I will see him in about 3 months time for an annual physical exam. 4. Today I spent 20 minutes with the patient explaining the importance of the blood test  we are doing and also we discussed nutrition.   No orders of the defined types were placed in this encounter.   Doree Albee, MD

## 2019-04-01 LAB — PSA, TOTAL AND FREE
PSA, % Free: 15 % (calc) — ABNORMAL LOW (ref >25)
PSA, Free: 0.6 ng/mL
PSA, Total: 4.1 ng/mL — ABNORMAL HIGH (ref <=4.0)

## 2019-04-07 NOTE — Progress Notes (Signed)
Contacted ; wife took message to have him look on mychart.

## 2019-04-16 ENCOUNTER — Ambulatory Visit (INDEPENDENT_AMBULATORY_CARE_PROVIDER_SITE_OTHER): Payer: 59 | Admitting: Diagnostic Neuroimaging

## 2019-04-16 ENCOUNTER — Other Ambulatory Visit: Payer: Self-pay

## 2019-04-16 ENCOUNTER — Encounter: Payer: 59 | Admitting: Diagnostic Neuroimaging

## 2019-04-16 DIAGNOSIS — R202 Paresthesia of skin: Secondary | ICD-10-CM

## 2019-04-16 DIAGNOSIS — Z0289 Encounter for other administrative examinations: Secondary | ICD-10-CM

## 2019-04-16 DIAGNOSIS — R200 Anesthesia of skin: Secondary | ICD-10-CM

## 2019-04-16 NOTE — Progress Notes (Signed)
Patient called. Left message on home machine to check his mychart for lab results and message.

## 2019-04-20 NOTE — Procedures (Signed)
[facility]  NCS (NERVE CONDUCTION STUDY) WITH EMG (ELECTROMYOGRAPHY) REPORT   STUDY DATE: 04/16/19 PATIENT NAME: Lee Hughes DOB: 08-03-1960 MRN: [id number]  ORDERING CLINICIAN: Andrey Spearman, MD   TECHNOLOGIST: Sherre Scarlet ELECTROMYOGRAPHER: Earlean Polka. Glenwood Revoir, MD  CLINICAL INFORMATION: 59 year old male with bilateral hand numbness.  FINDINGS: NERVE CONDUCTION STUDY:  Right median motor response has prolonged distal latency (5.0 ms), normal amplitude and normal conduction velocity.  Left median and right ulnar motor responses are normal.  Right median sensory response could not be obtained.  Left median sensory response has prolonged peak latency and decreased amplitude.  Right ulnar sensory response is normal.  Right ulnar F-wave latency is normal.   NEEDLE ELECTROMYOGRAPHY:  Needle examination of right upper extremity is normal.   IMPRESSION:   Abnormal study demonstrating: -Mild bilateral (right greater than left) median neuropathies at the wrists consistent with mild bilateral carpal tunnel syndrome (right greater than left).    INTERPRETING PHYSICIAN:  Penni Bombard, MD Certified in Neurology, Neurophysiology and Neuroimaging  [facility] [address], [place], [place] [address] [phone]  [facility]    Nerve / Sites Muscle Latency Ref. Amplitude Ref. Rel Amp Segments Distance Velocity Ref. Area    ms ms mV mV %  cm m/s m/s mVms  R Median - APB     Wrist APB 5.0 ?4.4 6.3 ?4.0 100 Wrist - APB 7   25.5     Upper arm APB 9.9  6.2  98.2 Upper arm - Wrist 24 49 ?49 24.6  L Median - APB     Wrist APB 3.9 ?4.4 11.1 ?4.0 100 Wrist - APB 7   39.7     Upper arm APB 8.2  10.4  94.3 Upper arm - Wrist 23 54 ?49 38.5  R Ulnar - ADM     Wrist ADM 3.3 ?3.3 13.7 ?6.0 100 Wrist - ADM 7   32.1     B.Elbow ADM 6.7  12.1  88 B.Elbow - Wrist 23 67 ?49 31.3     A.Elbow ADM 8.2  11.7  96.8 A.Elbow - B.Elbow 10 69 ?49  30.9         A.Elbow - Wrist               SNC    Nerve / Sites Rec. Site Peak Lat Ref.  Amp Ref. Segments Distance    ms ms V V  cm  R Median - Orthodromic (Dig II, Mid palm)     Dig II Wrist NR ?3.4 NR ?10 Dig II - Wrist 13  L Median - Orthodromic (Dig II, Mid palm)     Dig II Wrist 4.0 ?3.4 5 ?10 Dig II - Wrist 13  R Ulnar - Orthodromic, (Dig V, Mid palm)     Dig V Wrist 2.6 ?3.1 8 ?5 Dig V - Wrist 19           F  Wave    Nerve F Lat Ref.   ms ms  R Ulnar - ADM 30.2 ?32.0       EMG Summary Table    Spontaneous MUAP Recruitment  Muscle IA Fib PSW Fasc Other Amp Dur. Poly Pattern  R. Deltoid Normal None None None _______ Normal Normal Normal Normal  R. Biceps brachii Normal None None None _______ Normal Normal Normal Normal  R. Triceps brachii Normal None None None _______ Normal Normal Normal Normal  R. Flexor carpi radialis Normal  None None None _______ Normal Normal Normal Normal  R. First dorsal interosseous Normal None None None _______ Normal Normal Normal Normal

## 2019-04-28 MED FILL — OMEGA-3 ETHYL ESTERS 1 GM C: 1 | 30 days supply | Qty: 120 | Fill #3

## 2019-04-28 MED FILL — TAMSULOSIN HCL 0.4 MG CAP: 0.4 | 30 days supply | Qty: 30 | Fill #4

## 2019-04-29 ENCOUNTER — Telehealth (INDEPENDENT_AMBULATORY_CARE_PROVIDER_SITE_OTHER): Payer: Self-pay | Admitting: Internal Medicine

## 2019-04-29 ENCOUNTER — Other Ambulatory Visit (INDEPENDENT_AMBULATORY_CARE_PROVIDER_SITE_OTHER): Payer: Self-pay | Admitting: Internal Medicine

## 2019-04-29 DIAGNOSIS — F3341 Major depressive disorder, recurrent, in partial remission: Secondary | ICD-10-CM

## 2019-04-29 MED ORDER — ESCITALOPRAM OXALATE 5 MG PO TABS: 5.0000 mg | ORAL_TABLET | Freq: Every day | ORAL | 1 refills | Status: DC

## 2019-04-29 MED FILL — ESCITALOPRAM 5 MG TABLET: 5 | 90 days supply | Qty: 90 | Fill #0

## 2019-04-30 NOTE — Telephone Encounter (Signed)
Done

## 2019-05-05 DIAGNOSIS — M654 Radial styloid tenosynovitis [de Quervain]: Secondary | ICD-10-CM | POA: Diagnosis not present

## 2019-05-05 DIAGNOSIS — G5601 Carpal tunnel syndrome, right upper limb: Secondary | ICD-10-CM | POA: Diagnosis not present

## 2019-05-28 MED FILL — TAMSULOSIN HCL 0.4 MG CAP: 0.4 | 30 days supply | Qty: 30 | Fill #5

## 2019-05-29 ENCOUNTER — Ambulatory Visit: Payer: 59 | Admitting: Urology

## 2019-06-16 DIAGNOSIS — G5601 Carpal tunnel syndrome, right upper limb: Secondary | ICD-10-CM | POA: Diagnosis not present

## 2019-06-16 DIAGNOSIS — M654 Radial styloid tenosynovitis [de Quervain]: Secondary | ICD-10-CM | POA: Diagnosis not present

## 2019-06-29 ENCOUNTER — Other Ambulatory Visit (INDEPENDENT_AMBULATORY_CARE_PROVIDER_SITE_OTHER): Payer: Self-pay

## 2019-06-29 ENCOUNTER — Other Ambulatory Visit (INDEPENDENT_AMBULATORY_CARE_PROVIDER_SITE_OTHER): Payer: Self-pay | Admitting: Internal Medicine

## 2019-06-29 MED ORDER — OMEGA-3-ACID ETHYL ESTERS 1 G PO CAPS: ORAL_CAPSULE | ORAL | 3 refills | Status: DC

## 2019-06-29 MED FILL — TAMSULOSIN HCL 0.4 MG CAP: 0.4 | 30 days supply | Qty: 30 | Fill #6

## 2019-06-29 MED FILL — OMEGA-3 ETHYL ESTERS 1 GM C: 1 | 30 days supply | Qty: 120 | Fill #0

## 2019-07-01 ENCOUNTER — Encounter (INDEPENDENT_AMBULATORY_CARE_PROVIDER_SITE_OTHER): Payer: Self-pay | Admitting: Internal Medicine

## 2019-07-01 ENCOUNTER — Other Ambulatory Visit: Payer: Self-pay

## 2019-07-01 ENCOUNTER — Ambulatory Visit (INDEPENDENT_AMBULATORY_CARE_PROVIDER_SITE_OTHER): Payer: 59 | Admitting: Internal Medicine

## 2019-07-01 VITALS — BP 115/69 | HR 62 | Temp 97.1°F | Ht 71.0 in | Wt 193.8 lb

## 2019-07-01 DIAGNOSIS — E7800 Pure hypercholesterolemia, unspecified: Secondary | ICD-10-CM

## 2019-07-01 DIAGNOSIS — F3341 Major depressive disorder, recurrent, in partial remission: Secondary | ICD-10-CM

## 2019-07-01 DIAGNOSIS — R5381 Other malaise: Secondary | ICD-10-CM | POA: Diagnosis not present

## 2019-07-01 DIAGNOSIS — R5383 Other fatigue: Secondary | ICD-10-CM

## 2019-07-01 DIAGNOSIS — R972 Elevated prostate specific antigen [PSA]: Secondary | ICD-10-CM

## 2019-07-01 DIAGNOSIS — E559 Vitamin D deficiency, unspecified: Secondary | ICD-10-CM | POA: Diagnosis not present

## 2019-07-01 DIAGNOSIS — Z0001 Encounter for general adult medical examination with abnormal findings: Secondary | ICD-10-CM | POA: Diagnosis not present

## 2019-07-01 DIAGNOSIS — R7303 Prediabetes: Secondary | ICD-10-CM

## 2019-07-01 MED ORDER — ESCITALOPRAM OXALATE 5 MG PO TABS: 5.0000 mg | ORAL_TABLET | Freq: Every day | ORAL | 1 refills | Status: DC

## 2019-07-01 NOTE — Progress Notes (Signed)
Chief Complaint: This 59 year old man comes in for an annual physical exam and to address his other chronic conditions which are described below. HPI: He has a history of elevated PSA and last PSA was 4.1 with a percentage free PSA that was in the borderline range and we will willing to wait and see if this improves.  He is going to see his urologist in August to have another cystoscopy from his previous history of bladder cancer. He also has a history of hyperlipidemia and he is not on statin therapy.  He has done very well with improving nutrition and also does strength training on a regular basis. He also has a history of depression and Lexapro seems to help him very well.  He is now working full-time and his overall mood is improved. He has a previous history of prediabetes.  The last time we checked his A1c, it was in a good range. He suffers from lots of skin allergies and general allergies in general.  He takes Zyrtec twice a day to help with this. He has a history of vitamin D deficiency and continues to take vitamin D3 supplementation.  Past Medical History:  Diagnosis Date  . 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 Arthritis    bilateral fingers  . Asthma    as child, no longer problematic  . Bladder cancer [facility])    Status post surgery and immunotherapy 2018  . Depression   . Fatty liver   . Hypercholesteremia   . Irritable bowel syndrome   . Nephrolithiasis   . Prediabetes   . Throat swelling    from tick  . Tongue swelling    from tick  . Urticaria    from tick  . Varicella zoster    Past Surgical History:  Procedure Laterality Date  . COLONOSCOPY N/A 09/09/2012   Procedure: COLONOSCOPY;  Surgeon: Jamesetta So, MD;  [facility];  Service: Gastroenterology;  Laterality: N/A;  . Left femur Left 1988  . TRANSURETHRAL RESECTION OF BLADDER TUMOR N/A 12/18/2016   Procedure: CYSTOSCOPY TRANSURETHRAL RESECTION OF BLADDER TUMOR (TURBT);  Surgeon:  [name], MD;  Location: [facility];  Service: Urology;  Laterality: N/A;     Social History   Social History Narrative   Married 18 years. Lives with wife. 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   Caffeine- coffee 6 c daily    Social History   Tobacco Use  . Smoking status: Never Smoker  . Smokeless tobacco: Never Used  Substance Use Topics  . Alcohol use: Yes    Comment: occas      Allergies:  Allergies  Allergen Reactions  . No Known Allergies   . Other     No beef, pork, deer, products secondary from tick bite     Current Meds  Medication Sig  . aspirin 81 MG chewable tablet Chew 81 mg by mouth daily.   . Cetirizine HCl (ZYRTEC ALLERGY) 10 MG CAPS Take by mouth 2 (two) times daily.   . Cholecalciferol (VITAMIN D-3) 125 MCG (5000 UT) TABS Take by mouth.  . escitalopram (LEXAPRO) 5 MG tablet Take 1 tablet (5 mg total) by mouth daily.  . Multiple Vitamin (MULTIVITAMIN) capsule Take by mouth daily.   . niacin (NIASPAN) 1000 MG CR tablet Take 1,500 mg by mouth 2 (two) times daily.   Marland Kitchen omega-3 acid ethyl esters (LOVAZA) 1 g capsule TAKE 2 CAPSULES BY MOUTH TWO TIMES DAILY  .  tamsulosin (FLOMAX) 0.4 MG CAPS capsule Take 0.4 mg by mouth.  . [DISCONTINUED] escitalopram (LEXAPRO) 5 MG tablet Take 1 tablet (5 mg total) by mouth daily.       Depression screen [facility] 2/9 07/01/2019 03/31/2019  Decreased Interest 0 0  Down, Depressed, Hopeless 0 0  PHQ - 2 Score 0 0     ZHY:QMVHQ from the symptoms mentioned above,there are no other symptoms referable to all systems reviewed.       Physical Exam: Blood pressure 115/69, pulse 62, temperature (!) 97.1 F (36.2 C), temperature source Temporal, height 5\' 11"  (1.803 m), weight 193 lb 12.8 oz (87.9 kg), SpO2 96 %. Vitals with BMI 07/01/2019 03/31/2019 03/10/2019  Height 5\' 11"  5\' 11"  5\' 11"   Weight 193 lbs 13 oz 194 lbs 6 oz 199 lbs  BMI 27.04 46.96 29.52  Systolic 841 324 401  Diastolic 69 65 72  Pulse 62 58 67       He looks systemically well.  Weight is very stable.  Blood pressure excellent. General: Alert, cooperative, and appears to be the stated age.No pallor.  No jaundice.  No clubbing. Head: Normocephalic Eyes: Sclera white, pupils equal and reactive to light, red reflex x 2,  Ears: Normal bilaterally Oral cavity: Lips, mucosa, and tongue normal: Teeth and gums normal Neck: No adenopathy, supple, symmetrical, trachea midline, and thyroid does not appear enlarged Respiratory: Clear to auscultation bilaterally.No wheezing, crackles or bronchial breathing. Cardiovascular: Heart sounds are present and appear to be normal without murmurs or added sounds.  No carotid bruits.  Peripheral pulses are present and equal bilaterally.: Gastrointestinal:positive bowel sounds, no hepatosplenomegaly.  No masses felt.No tenderness. Skin: Clear, No rashes noted.No worrisome skin lesions seen. Neurological: Grossly intact without focal findings, cranial nerves II through XII intact, muscle strength equal bilaterally Musculoskeletal: No acute joint abnormalities noted.Full range of movement noted with joints. Psychiatric: Affect appropriate, non-anxious.    Assessment  1. Recurrent major depressive disorder, in partial remission (HCC)   2. Elevated PSA   3. Hypercholesteremia   4. Prediabetes   5. Malaise and fatigue   6. Vitamin D deficiency disease     Tests Ordered:   Orders Placed This Encounter  Procedures  . CBC  . COMPLETE METABOLIC PANEL WITH GFR  . Lipid panel  . Hemoglobin A1c  . T3, free  . TSH  . PSA, Total with Reflex to PSA, Free  . VITAMIN D 25 Hydroxy (Vit-D Deficiency, Fractures)     Plan  1. Overall, fairly healthy 59 year old man. 2. His depression is well controlled on Lexapro and we will continue the same dose for the time being. 3. His hyperlipidemia/hypercholesterolemia is not being treated with statin therapy.  We will check lipid panel now. 4. He had a previous  history of prediabetes and we will check an A1c again. 5. We will repeat his PSA with percentage free PSA added as needed depending on his total PSA.  Hopefully it  is improved. 6. He will continue with vitamin D3 supplementation for vitamin D deficiency.  We will check levels today. 7. Follow-up in 4 months and further recommendations will depend on blood results. 8. Today, in addition to a preventative visit, I performed  independently an office visit to address his chronic conditions and symptoms above.     Meds ordered this encounter  Medications  . escitalopram (LEXAPRO) 5 MG tablet    Sig: Take 1 tablet (5 mg total) by mouth daily.    Dispense:  90 tablet    Refill:  1    Patient would like the medication mailed to his home.     Jennet Scroggin C Matti Killingsworth   07/01/2019, 4:32 PM

## 2019-07-02 LAB — COMPLETE METABOLIC PANEL WITH GFR
AG Ratio: 1.9 (calc) (ref 1.0–2.5)
ALT: 27 U/L (ref 9–46)
AST: 25 U/L (ref 10–35)
Albumin: 4.6 g/dL (ref 3.6–5.1)
Alkaline phosphatase (APISO): 93 U/L (ref 35–144)
BUN: 20 mg/dL (ref 7–25)
CO2: 30 mmol/L (ref 20–32)
Calcium: 9.7 mg/dL (ref 8.6–10.3)
Chloride: 105 mmol/L (ref 98–110)
Creat: 0.94 mg/dL (ref 0.70–1.33)
GFR, Est African American: 104 mL/min/{1.73_m2} (ref >=60)
GFR, Est Non African American: 90 mL/min/{1.73_m2} (ref >=60)
Globulin: 2.4 g/dL (calc) (ref 1.9–3.7)
Glucose, Bld: 89 mg/dL (ref 65–99)
Potassium: 4.4 mmol/L (ref 3.5–5.3)
Sodium: 140 mmol/L (ref 135–146)
Total Bilirubin: 0.4 mg/dL (ref 0.2–1.2)
Total Protein: 7 g/dL (ref 6.1–8.1)

## 2019-07-02 LAB — CBC
HCT: 47.4 % (ref 38.5–50.0)
Hemoglobin: 14.9 g/dL (ref 13.2–17.1)
MCH: 25.6 pg — ABNORMAL LOW (ref 27.0–33.0)
MCHC: 31.4 g/dL — ABNORMAL LOW (ref 32.0–36.0)
MCV: 81.4 fL (ref 80.0–100.0)
MPV: 9.8 fL (ref 7.5–12.5)
Platelets: 232 10*3/uL (ref 140–400)
RBC: 5.82 10*6/uL — ABNORMAL HIGH (ref 4.20–5.80)
RDW: 14 % (ref 11.0–15.0)
WBC: 5.3 10*3/uL (ref 3.8–10.8)

## 2019-07-02 LAB — LIPID PANEL
Cholesterol: 261 mg/dL — ABNORMAL HIGH (ref <200)
HDL: 34 mg/dL — ABNORMAL LOW (ref >=40)
Non-HDL Cholesterol (Calc): 227 mg/dL (calc) — ABNORMAL HIGH (ref <130)
Total CHOL/HDL Ratio: 7.7 (calc) — ABNORMAL HIGH (ref <5.0)
Triglycerides: 810 mg/dL — ABNORMAL HIGH (ref <150)

## 2019-07-02 LAB — TSH: TSH: 1.69 mIU/L (ref 0.40–4.50)

## 2019-07-02 LAB — VITAMIN D 25 HYDROXY (VIT D DEFICIENCY, FRACTURES): Vit D, 25-Hydroxy: 33 ng/mL (ref 30–100)

## 2019-07-02 LAB — T3, FREE: T3, Free: 3.1 pg/mL (ref 2.3–4.2)

## 2019-07-02 LAB — REFLEX PSA, FREE
PSA, % Free: 17 % (calc) — ABNORMAL LOW (ref >25)
PSA, Free: 0.7 ng/mL

## 2019-07-02 LAB — HEMOGLOBIN A1C
Hgb A1c MFr Bld: 5.3 % of total Hgb (ref <5.7)
Mean Plasma Glucose: 105 (calc)
eAG (mmol/L): 5.8 (calc)

## 2019-07-02 LAB — PSA, TOTAL WITH REFLEX TO PSA, FREE: PSA, Total: 4.1 ng/mL — ABNORMAL HIGH (ref <=4.0)

## 2019-07-07 NOTE — Progress Notes (Signed)
Called ;lvm to set appt to cone into see him soon.

## 2019-07-07 NOTE — Progress Notes (Signed)
I have sent a message on my chart to this patient but call him and schedule him an appointment to see me in the next few weeks to discuss his significantly elevated triglyceride levels as well as cholesterol.  Thanks.

## 2019-07-28 ENCOUNTER — Ambulatory Visit (INDEPENDENT_AMBULATORY_CARE_PROVIDER_SITE_OTHER): Payer: 59 | Admitting: Internal Medicine

## 2019-07-28 ENCOUNTER — Other Ambulatory Visit: Payer: Self-pay

## 2019-07-28 ENCOUNTER — Encounter (INDEPENDENT_AMBULATORY_CARE_PROVIDER_SITE_OTHER): Payer: Self-pay | Admitting: Internal Medicine

## 2019-07-28 VITALS — BP 120/75 | HR 67 | Temp 97.2°F | Ht 71.0 in | Wt 195.0 lb

## 2019-07-28 DIAGNOSIS — E782 Mixed hyperlipidemia: Secondary | ICD-10-CM

## 2019-07-28 NOTE — Progress Notes (Signed)
Metrics: Intervention Frequency ACO  Documented Smoking Status Yearly  Screened one or more times in 24 months  Cessation Counseling or  Active cessation medication Past 24 months  Past 24 months   Guideline developer: UpToDate (See UpToDate for funding source) Date Released: 2014       Wellness Office Visit  Subjective:  Patient ID: Lee Hughes, male    DOB: [birth year]  Age: 59 y.o. MRN: [id number]  CC: This man comes in post annual physical to review his lipid panel. HPI  He has a triglyceride level of over 800! This is surprising because his body fat percentage when I checked at the annual physical was actually very good.  He takes Lovaza twice a day. Past Medical History:  Diagnosis Date  . 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 Arthritis    bilateral fingers  . Asthma    as child, no longer problematic  . Bladder cancer [facility])    Status post surgery and immunotherapy 2018  . Depression   . Fatty liver   . Hypercholesteremia   . Irritable bowel syndrome   . Nephrolithiasis   . Prediabetes   . Throat swelling    from tick  . Tongue swelling    from tick  . Urticaria    from tick  . Varicella zoster    Past Surgical History:  Procedure Laterality Date  . COLONOSCOPY N/A 09/09/2012   Procedure: COLONOSCOPY;  Surgeon: Jamesetta So, MD;  [facility];  Service: Gastroenterology;  Laterality: N/A;  . Left femur Left 1988  . TRANSURETHRAL RESECTION OF BLADDER TUMOR N/A 12/18/2016   Procedure: CYSTOSCOPY TRANSURETHRAL RESECTION OF BLADDER TUMOR (TURBT);  Surgeon: [name], MD;  Location: [facility];  Service: Urology;  Laterality: N/A;     Family History  Problem Relation Age of Onset  . Pancreatic cancer Brother   . Pancreatic disease Brother   . Alcoholism Mother   . Cirrhosis Mother   . Alcoholism Father   . Broken bones Father   . Stroke Maternal Grandmother   . Cancer Maternal Grandfather   . Diabetes Maternal  Grandfather   . Emphysema Paternal Grandfather   . Dementia Paternal Aunt   . Dementia Paternal Uncle   . Allergic rhinitis Neg Hx   . Angioedema Neg Hx   . Asthma Neg Hx   . Atopy Neg Hx   . Eczema Neg Hx   . Immunodeficiency Neg Hx   . Urticaria Neg Hx     Social History   Social History Narrative   Married 18 years. Lives with wife. 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   Caffeine- coffee 6 c daily   Social History   Tobacco Use  . Smoking status: Never Smoker  . Smokeless tobacco: Never Used  Substance Use Topics  . Alcohol use: Yes    Comment: occas    Current Meds  Medication Sig  . aspirin 81 MG chewable tablet Chew 81 mg by mouth daily.   . Cetirizine HCl (ZYRTEC ALLERGY) 10 MG CAPS Take by mouth 2 (two) times daily.   . Cholecalciferol (VITAMIN D-3) 125 MCG (5000 UT) TABS Take 2 tablets by mouth daily.   Marland Kitchen escitalopram (LEXAPRO) 5 MG tablet Take 1 tablet (5 mg total) by mouth daily.  . Multiple Vitamin (MULTIVITAMIN) capsule Take by mouth daily.   . niacin (NIASPAN) 1000 MG CR tablet Take 1,500 mg by mouth 2 (two) times daily.   Marland Kitchen  omega-3 acid ethyl esters (LOVAZA) 1 g capsule TAKE 2 CAPSULES BY MOUTH TWO TIMES DAILY  . tamsulosin (FLOMAX) 0.4 MG CAPS capsule Take 0.4 mg by mouth.       Depression screen [facility] 2/9 07/01/2019 03/31/2019  Decreased Interest 0 0  Down, Depressed, Hopeless 0 0  PHQ - 2 Score 0 0     Objective:   Today's Vitals: BP 120/75 (BP Location: Left Arm, Patient Position: Sitting, Cuff Size: Normal)   Pulse 67   Temp (!) 97.2 F (36.2 C) (Temporal)   Ht 5\' 11"  (1.803 m)   Wt 195 lb (88.5 kg)   SpO2 97%   BMI 27.20 kg/m  Vitals with BMI 07/28/2019 07/01/2019 03/31/2019  Height 5\' 11"  5\' 11"  5\' 11"   Weight 195 lbs 193 lbs 13 oz 194 lbs 6 oz  BMI 27.21 09.38 18.29  Systolic 937 169 678  Diastolic 75 69 65  Pulse 67 62 58     Physical Exam  He looks systemically well.  Weight stable.  Blood pressure excellent.    Assessment   1. Mixed  hyperlipidemia       Tests ordered No orders of the defined types were placed in this encounter.    Plan: 1. I had a long discussion about his hypertriglyceridemia as well as hypercholesterolemia.  We need to further reduce visceral fat and we discussed the importance of consistent intermittent fasting combined with a plant-based diet.  I recommended that at this stage, he avoid all animal protein.  I discussed in detail what that meant. 2. I will see him in October to see how he is and we will do fasting blood work then.   No orders of the defined types were placed in this encounter.   Doree Albee, MD

## 2019-07-29 DIAGNOSIS — G5601 Carpal tunnel syndrome, right upper limb: Secondary | ICD-10-CM | POA: Diagnosis not present

## 2019-07-29 DIAGNOSIS — M65351 Trigger finger, right little finger: Secondary | ICD-10-CM | POA: Diagnosis not present

## 2019-07-29 DIAGNOSIS — M654 Radial styloid tenosynovitis [de Quervain]: Secondary | ICD-10-CM | POA: Diagnosis not present

## 2019-07-30 DIAGNOSIS — M6530 Trigger finger, unspecified finger: Secondary | ICD-10-CM | POA: Insufficient documentation

## 2019-07-31 MED FILL — TAMSULOSIN HCL 0.4 MG CAP: 0.4 | 30 days supply | Qty: 30 | Fill #7

## 2019-08-03 MED FILL — ESCITALOPRAM 5 MG TABLET: 5 | 90 days supply | Qty: 90 | Fill #1

## 2019-08-03 MED FILL — OMEGA-3 ETHYL ESTERS 1 GM C: 1 | 30 days supply | Qty: 120 | Fill #1

## 2019-08-14 ENCOUNTER — Other Ambulatory Visit: Payer: Self-pay

## 2019-08-14 ENCOUNTER — Other Ambulatory Visit: Payer: 59 | Admitting: Urology

## 2019-08-14 ENCOUNTER — Ambulatory Visit (INDEPENDENT_AMBULATORY_CARE_PROVIDER_SITE_OTHER): Payer: 59 | Admitting: Urology

## 2019-08-14 DIAGNOSIS — Z8551 Personal history of malignant neoplasm of bladder: Secondary | ICD-10-CM

## 2019-08-14 DIAGNOSIS — N402 Nodular prostate without lower urinary tract symptoms: Secondary | ICD-10-CM | POA: Diagnosis not present

## 2019-08-14 DIAGNOSIS — R972 Elevated prostate specific antigen [PSA]: Secondary | ICD-10-CM

## 2019-08-14 LAB — URINALYSIS, ROUTINE W REFLEX MICROSCOPIC
Bilirubin, UA: NEGATIVE
Glucose, UA: NEGATIVE
Ketones, UA: NEGATIVE
Leukocytes,UA: NEGATIVE
Nitrite, UA: NEGATIVE
Protein,UA: NEGATIVE
RBC, UA: NEGATIVE
Specific Gravity, UA: >1.03 — ABNORMAL HIGH (ref 1.005–1.030)
Urobilinogen, Ur: 0.2 mg/dL (ref 0.2–1.0)
pH, UA: 5.5 (ref 5.0–7.5)

## 2019-08-14 MED ORDER — CIPROFLOXACIN HCL 500 MG PO TABS
500.0000 mg | ORAL_TABLET | Freq: Once | ORAL | Status: AC
  Administered 2019-08-14: 500 mg via ORAL

## 2019-08-14 NOTE — Progress Notes (Signed)
Subjective:  1. History of bladder cancer   2. Elevated PSA   3. Prostate nodule     Lee Hughes returns today in f/u for his history of bladder cancer and returns for cystoscopy. He has had no hematuria. 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   He had BCG maintenance with the last treatment on 03/16/19.  His PSA on 03/31/19 was 4.1 and it was 4.1 again on 07/01/19.   He had a TURBT of a 3cm tumor on 12/18/16 from the left lateral wall. It was multifocal Ta primarily low grade disease with some foci of high risk features. He completed induction BCG on 02/22/17    He has progressive ED and was given sildenafil at his last visit but he never got that filled.       ROS:  ROS:  A complete review of systems was performed.  All systems are negative except for pertinent findings as noted.   Review of Systems  All other systems reviewed and are negative.   Allergies  Allergen Reactions  . No Known Allergies   . Other     No beef, pork, deer, products secondary from tick bite    Outpatient Encounter Medications as of 08/14/2019  Medication Sig  . aspirin 81 MG chewable tablet Chew 81 mg by mouth daily.   . Cetirizine HCl (ZYRTEC ALLERGY) 10 MG CAPS Take by mouth 2 (two) times daily.   . Cholecalciferol (VITAMIN D-3) 125 MCG (5000 UT) TABS Take 2 tablets by mouth daily.   Marland Kitchen escitalopram (LEXAPRO) 5 MG tablet Take 1 tablet (5 mg total) by mouth daily.  . Multiple Vitamin (MULTIVITAMIN) capsule Take by mouth daily.   . niacin (NIASPAN) 1000 MG CR tablet Take 1,500 mg by mouth 2 (two) times daily.   Marland Kitchen omega-3 acid ethyl esters (LOVAZA) 1 g capsule TAKE 2 CAPSULES BY MOUTH TWO TIMES DAILY  . tamsulosin (FLOMAX) 0.4 MG CAPS capsule Take 0.4 mg by mouth.  . [EXPIRED] ciprofloxacin (CIPRO) tablet 500 mg    No facility-administered encounter medications on file as of 08/14/2019.    Past Medical History:  Diagnosis Date  . 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 Arthritis     bilateral fingers  . Asthma    as child, no longer problematic  . Bladder cancer [facility])    Status post surgery and immunotherapy 2018  . Depression   . Fatty liver   . Hypercholesteremia   . Irritable bowel syndrome   . Nephrolithiasis   . Prediabetes   . Throat swelling    from tick  . Tongue swelling    from tick  . Urticaria    from tick  . Varicella zoster     Past Surgical History:  Procedure Laterality Date  . COLONOSCOPY N/A 09/09/2012   Procedure: COLONOSCOPY;  Surgeon: Jamesetta So, MD;  Location[facility];  Service: Gastroenterology;  Laterality: N/A;  . Left femur Left 1988  . TRANSURETHRAL RESECTION OF BLADDER TUMOR N/A 12/18/2016   Procedure: CYSTOSCOPY TRANSURETHRAL RESECTION OF BLADDER TUMOR (TURBT);  Surgeon: [name], MD;  Location: [facility];  Service: Urology;  Laterality: N/A;    Social History   Socioeconomic History  . Marital status: Married    Spouse name: [name]  . Number of children: Not on file  . Years of education: Not on file  . Highest education level: Associate degree: academic program  Occupational History  .  Not on file  Tobacco Use  . Smoking status: Never Smoker  . Smokeless tobacco: Never Used  Vaping Use  . Vaping Use: Never used  Substance and Sexual Activity  . Alcohol use: Yes    Comment: occas  . Drug use: Never  . Sexual activity: Not Currently  Other Topics Concern  . Not on file  Social History Narrative   Married 18 years. Lives with wife. 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   Caffeine- coffee 6 c daily   Social Determinants of Health   Financial Resource Strain:   . Difficulty of Paying Living Expenses:   Food Insecurity:   . Worried About Charity fundraiser in the Last Year:   . Arboriculturist in the Last Year:   Transportation Needs:   . Film/video editor (Medical):   Marland Kitchen Lack of Transportation (Non-Medical):   Physical Activity:   . Days of Exercise per Week:   . Minutes of Exercise  per Session:   Stress:   . Feeling of Stress :   Social Connections:   . Frequency of Communication with Friends and Family:   . Frequency of Social Gatherings with Friends and Family:   . Attends Religious Services:   . Active Member of Clubs or Organizations:   . Attends Archivist Meetings:   Marland Kitchen Marital Status:   Intimate Partner Violence:   . Fear of Current or Ex-Partner:   . Emotionally Abused:   Marland Kitchen Physically Abused:   . Sexually Abused:     Family History  Problem Relation Age of Onset  . Pancreatic cancer Brother   . Pancreatic disease Brother   . Alcoholism Mother   . Cirrhosis Mother   . Alcoholism Father   . Broken bones Father   . Stroke Maternal Grandmother   . Cancer Maternal Grandfather   . Diabetes Maternal Grandfather   . Emphysema Paternal Grandfather   . Dementia Paternal Aunt   . Dementia Paternal Uncle   . Allergic rhinitis Neg Hx   . Angioedema Neg Hx   . Asthma Neg Hx   . Atopy Neg Hx   . Eczema Neg Hx   . Immunodeficiency Neg Hx   . Urticaria Neg Hx        Objective: There were no vitals filed for this visit.   Physical Exam Genitourinary:    Comments: AP without lesions. NST without mass. Prostate 2.5+ Benign consistency with a soft nodule at the left base.   SV non-palpable.     Lab Results:  Results for orders placed or performed in visit on 08/14/19 (from the past 24 hour(s))  Urinalysis, Routine w reflex microscopic     Status: Abnormal   Collection Time: 08/14/19  3:45 PM  Result Value Ref Range   Specific Gravity, UA >1.030 (H) 1.005 - 1.030   pH, UA 5.5 5.0 - 7.5   Color, UA Yellow Yellow   Appearance Ur Clear Clear   Leukocytes,UA Negative Negative   Protein,UA Negative Negative/Trace   Glucose, UA Negative Negative   Ketones, UA Negative Negative   RBC, UA Negative Negative   Bilirubin, UA Negative Negative   Urobilinogen, Ur 0.2 0.2 - 1.0 mg/dL   Nitrite, UA Negative Negative   Microscopic Examination  Comment    Narrative   Performed at:  [facility] [address], [place], [place]  [id number] Lab Director: Mina Marble MT, Phone:  [phone]    BMET No results for  input(s): NA, K, CL, CO2, GLUCOSE, BUN, CREATININE, CALCIUM in the last 72 hours. PSA PSA  Date Value Ref Range Status  03/19/2019 4.1 (H) < OR = 4.0 ng/mL Final    Comment:    The total PSA value from this assay system is  standardized against the WHO standard. The test  result will be approximately 20% lower when compared  to the equimolar-standardized total PSA (Beckman  Coulter). Comparison of serial PSA results should be  interpreted with this fact in mind. . This test was performed using the Siemens  chemiluminescent method. Values obtained from  different assay methods cannot be used interchangeably. PSA levels, regardless of value, should not be interpreted as absolute evidence of the presence or absence of disease.    No results found for: TESTOSTERONE    Studies/Results: Cystoscopy:   He was prepped with betadine, draped with sterile towels and the urethra was instilled with 2% lidocaine.  He was given Cipro 500mg  to cover the procedure.  Urethra: normal.  Prostate: 3cm with bilobar hyperplasia with obstruction.  No middle lobe.  Bladder: Mild trabeculation without tumors, stones or inflammation.  Ureteral orifices: normal.  Complications: none.       Assessment & Plan: 1. History of bladder cancer.   He will need weekly BCG x 3 but we will wait until about 2-3 weeks after his last COVID vaccine which is scheduled for 09/11/19 and then f/u in 6 months for cystoscopy.  Cytology today.  2. BPH with BOO and mild LUTS.   He needs no treatment at this time.  He has soft nodularity of the left base and a mild but stable PSA elevation of 4.1 both of which could be related to his BCG therapies.   He will get me his prior PSA levels and I will have him return toward the end of the year  with another PSA for a repeat exam prior to considering a biopsy.     Meds ordered this encounter  Medications  . ciprofloxacin (CIPRO) tablet 500 mg     Orders Placed This Encounter  Procedures  . Urinalysis, Routine w reflex microscopic  . PSA, total and free    Standing Status:   Future    Standing Expiration Date:   12/14/2019      Return for F/u at the end of September for BCG weekly x 3.  then return in 3 months with a PSA. Marland Kitchen   CC: Doree Albee, MD      [name] 08/14/2019

## 2019-08-17 LAB — CYTOLOGY, URINE

## 2019-08-18 NOTE — Progress Notes (Signed)
Letter sent via my chart

## 2019-08-21 DIAGNOSIS — Z23 Encounter for immunization: Secondary | ICD-10-CM | POA: Diagnosis not present

## 2019-09-04 ENCOUNTER — Telehealth: Payer: Self-pay

## 2019-09-04 NOTE — Telephone Encounter (Signed)
PA started for BCG treatments starting 09/25/2019  Clinicals submitted to [facility] as requested.

## 2019-09-11 DIAGNOSIS — Z23 Encounter for immunization: Secondary | ICD-10-CM | POA: Diagnosis not present

## 2019-09-14 MED FILL — OMEGA-3-ACID ETHYL ESTERS 1: 1 | 30 days supply | Qty: 120 | Fill #2

## 2019-09-16 DIAGNOSIS — M654 Radial styloid tenosynovitis [de Quervain]: Secondary | ICD-10-CM | POA: Diagnosis not present

## 2019-09-16 DIAGNOSIS — M65351 Trigger finger, right little finger: Secondary | ICD-10-CM | POA: Diagnosis not present

## 2019-09-16 DIAGNOSIS — G5601 Carpal tunnel syndrome, right upper limb: Secondary | ICD-10-CM | POA: Diagnosis not present

## 2019-09-25 ENCOUNTER — Other Ambulatory Visit: Payer: Self-pay

## 2019-09-25 ENCOUNTER — Ambulatory Visit (INDEPENDENT_AMBULATORY_CARE_PROVIDER_SITE_OTHER): Payer: 59

## 2019-09-25 ENCOUNTER — Ambulatory Visit: Payer: 59

## 2019-09-25 DIAGNOSIS — Z8551 Personal history of malignant neoplasm of bladder: Secondary | ICD-10-CM | POA: Diagnosis not present

## 2019-09-25 LAB — URINALYSIS, ROUTINE W REFLEX MICROSCOPIC
Bilirubin, UA: NEGATIVE
Glucose, UA: NEGATIVE
Ketones, UA: NEGATIVE
Leukocytes,UA: NEGATIVE
Nitrite, UA: NEGATIVE
Protein,UA: NEGATIVE
RBC, UA: NEGATIVE
Specific Gravity, UA: >1.03 — ABNORMAL HIGH (ref 1.005–1.030)
Urobilinogen, Ur: 0.2 mg/dL (ref 0.2–1.0)
pH, UA: 5.5 (ref 5.0–7.5)

## 2019-09-25 MED ORDER — BCG LIVE 50 MG IS SUSR
3.2400 mL | Freq: Once | INTRAVESICAL | Status: AC
  Administered 2019-09-25: 81 mg via INTRAVESICAL

## 2019-09-25 NOTE — Progress Notes (Signed)
BCG Bladder Instillation  BCG # 1 of 3  Due to Bladder Cancer patient is present today for a BCG treatment. Patient was cleaned and prepped in a sterile fashion with betadine. A 14FR catheter was inserted, urine return was noted 7ml, urine was yellow in color.  24ml of reconstituted BCG was instilled into the bladder. The catheter was then removed. Patient tolerated well, no complications were noted  Preformed by: Valentina Lucks  Follow up/ Additional notes: 1 wk

## 2019-09-30 MED FILL — TAMSULOSIN HCL 0.4 MG CAP: 0.4 | 30 days supply | Qty: 30 | Fill #9

## 2019-10-02 ENCOUNTER — Other Ambulatory Visit: Payer: Self-pay

## 2019-10-02 ENCOUNTER — Ambulatory Visit (INDEPENDENT_AMBULATORY_CARE_PROVIDER_SITE_OTHER): Payer: 59

## 2019-10-02 ENCOUNTER — Ambulatory Visit: Payer: 59

## 2019-10-02 DIAGNOSIS — Z8551 Personal history of malignant neoplasm of bladder: Secondary | ICD-10-CM | POA: Diagnosis not present

## 2019-10-02 LAB — URINALYSIS, ROUTINE W REFLEX MICROSCOPIC
Bilirubin, UA: NEGATIVE
Glucose, UA: NEGATIVE
Ketones, UA: NEGATIVE
Leukocytes,UA: NEGATIVE
Nitrite, UA: NEGATIVE
Protein,UA: NEGATIVE
RBC, UA: NEGATIVE
Specific Gravity, UA: 1.02 (ref 1.005–1.030)
Urobilinogen, Ur: 0.2 mg/dL (ref 0.2–1.0)
pH, UA: 6 (ref 5.0–7.5)

## 2019-10-02 MED ORDER — BCG LIVE 50 MG IS SUSR: 3.2400 mL | Freq: Once | INTRAVESICAL | Status: DC

## 2019-10-02 NOTE — Patient Instructions (Signed)

## 2019-10-02 NOTE — Progress Notes (Signed)
BCG Bladder Instillation  BCG # 2 of 3  Due to Bladder Cancer patient is present today for a BCG treatment. Patient was cleaned and prepped in a sterile fashion with betadine. A 14FR catheter was inserted, urine return was noted 100 ml, urine was yellow in color.  40ml of reconstituted BCG was instilled into the bladder. The catheter was then removed. Patient tolerated well, no complications were noted  Performed by: Devany Aja, LPN  Follow up/ Additional notes: keep next [place] appt for BCG #3 of 3

## 2019-10-09 ENCOUNTER — Ambulatory Visit (INDEPENDENT_AMBULATORY_CARE_PROVIDER_SITE_OTHER): Payer: 59

## 2019-10-09 ENCOUNTER — Other Ambulatory Visit: Payer: Self-pay

## 2019-10-09 ENCOUNTER — Ambulatory Visit: Payer: 59

## 2019-10-09 DIAGNOSIS — Z8551 Personal history of malignant neoplasm of bladder: Secondary | ICD-10-CM | POA: Diagnosis not present

## 2019-10-09 LAB — MICROSCOPIC EXAMINATION
Bacteria, UA: NONE SEEN
Renal Epithel, UA: NONE SEEN /hpf

## 2019-10-09 LAB — URINALYSIS, ROUTINE W REFLEX MICROSCOPIC
Bilirubin, UA: NEGATIVE
Glucose, UA: NEGATIVE
Ketones, UA: NEGATIVE
Leukocytes,UA: NEGATIVE
Nitrite, UA: NEGATIVE
RBC, UA: NEGATIVE
Specific Gravity, UA: 1.025 (ref 1.005–1.030)
Urobilinogen, Ur: 0.2 mg/dL (ref 0.2–1.0)
pH, UA: 7 (ref 5.0–7.5)

## 2019-10-09 MED ORDER — BCG LIVE 50 MG IS SUSR
3.2400 mL | Freq: Once | INTRAVESICAL | Status: AC
  Administered 2019-10-09: 81 mg via INTRAVESICAL

## 2019-10-09 NOTE — Progress Notes (Signed)
BCG Bladder Instillation  BCG # 3 of 3  Due to Bladder Cancer patient is present today for a BCG treatment. Patient was cleaned and prepped in a sterile fashion with betadine. A 14FR catheter was inserted, urine return was noted 84ml, urine was yellow in color.  21ml of reconstituted BCG was instilled into the bladder. The catheter was then removed. Patient tolerated well, no complications were noted  Preformed by:  Antionette Char, Amed Datta,LPN

## 2019-10-09 NOTE — Addendum Note (Signed)
Addended by: Valentina Lucks on: 10/09/2019 04:33 PM   Modules accepted: Orders

## 2019-10-16 MED FILL — OMEGA-3-ACID ETHYL ESTERS 1: 1 | 30 days supply | Qty: 120 | Fill #3

## 2019-10-23 ENCOUNTER — Ambulatory Visit: Payer: 59

## 2019-10-23 DIAGNOSIS — M9902 Segmental and somatic dysfunction of thoracic region: Secondary | ICD-10-CM | POA: Diagnosis not present

## 2019-10-23 DIAGNOSIS — M9903 Segmental and somatic dysfunction of lumbar region: Secondary | ICD-10-CM | POA: Diagnosis not present

## 2019-10-23 DIAGNOSIS — M9905 Segmental and somatic dysfunction of pelvic region: Secondary | ICD-10-CM | POA: Diagnosis not present

## 2019-10-23 DIAGNOSIS — M62830 Muscle spasm of back: Secondary | ICD-10-CM | POA: Diagnosis not present

## 2019-10-28 DIAGNOSIS — M9902 Segmental and somatic dysfunction of thoracic region: Secondary | ICD-10-CM | POA: Diagnosis not present

## 2019-10-28 DIAGNOSIS — M9903 Segmental and somatic dysfunction of lumbar region: Secondary | ICD-10-CM | POA: Diagnosis not present

## 2019-10-28 DIAGNOSIS — M9905 Segmental and somatic dysfunction of pelvic region: Secondary | ICD-10-CM | POA: Diagnosis not present

## 2019-10-28 DIAGNOSIS — M62830 Muscle spasm of back: Secondary | ICD-10-CM | POA: Diagnosis not present

## 2019-10-29 ENCOUNTER — Ambulatory Visit (INDEPENDENT_AMBULATORY_CARE_PROVIDER_SITE_OTHER): Payer: 59 | Admitting: Internal Medicine

## 2019-10-30 ENCOUNTER — Ambulatory Visit: Payer: 59

## 2019-10-30 ENCOUNTER — Other Ambulatory Visit: Payer: Self-pay

## 2019-10-30 MED ORDER — TAMSULOSIN HCL 0.4 MG PO CAPS
0.4000 mg | ORAL_CAPSULE | Freq: Every day | ORAL | 3 refills | Status: DC
Start: 2019-10-30 — End: 2019-10-30

## 2019-10-30 MED FILL — TAMSULOSIN HCL 0.4 MG CAP: 0.4 | 90 days supply | Qty: 90 | Fill #0

## 2019-11-03 ENCOUNTER — Other Ambulatory Visit: Payer: Self-pay

## 2019-11-03 ENCOUNTER — Encounter (INDEPENDENT_AMBULATORY_CARE_PROVIDER_SITE_OTHER): Payer: Self-pay | Admitting: Nurse Practitioner

## 2019-11-03 ENCOUNTER — Ambulatory Visit (INDEPENDENT_AMBULATORY_CARE_PROVIDER_SITE_OTHER): Payer: 59 | Admitting: Nurse Practitioner

## 2019-11-03 VITALS — BP 118/70 | HR 62 | Temp 97.3°F | Ht 71.0 in | Wt 196.2 lb

## 2019-11-03 DIAGNOSIS — R7303 Prediabetes: Secondary | ICD-10-CM | POA: Diagnosis not present

## 2019-11-03 DIAGNOSIS — Z23 Encounter for immunization: Secondary | ICD-10-CM | POA: Diagnosis not present

## 2019-11-03 DIAGNOSIS — F3341 Major depressive disorder, recurrent, in partial remission: Secondary | ICD-10-CM

## 2019-11-03 DIAGNOSIS — E782 Mixed hyperlipidemia: Secondary | ICD-10-CM

## 2019-11-03 DIAGNOSIS — E559 Vitamin D deficiency, unspecified: Secondary | ICD-10-CM | POA: Diagnosis not present

## 2019-11-03 NOTE — Patient Instructions (Signed)
Influenza (Flu) Vaccine (Inactivated or Recombinant): What You Need to Know 1. Why get vaccinated? Influenza vaccine can prevent influenza (flu). Flu is a contagious disease that spreads around the [place] every year, usually between October and May. Anyone can get the flu, but it is more dangerous for some people. Infants and young children, people 57 years of age and older, pregnant women, and people with certain health conditions or a weakened immune system are at greatest risk of flu complications. Pneumonia, bronchitis, sinus infections and ear infections are examples of flu-related complications. If you have a medical condition, such as heart disease, cancer or diabetes, flu can make it worse. Flu can cause fever and chills, sore throat, muscle aches, fatigue, cough, headache, and runny or stuffy nose. Some people may have vomiting and diarrhea, though this is more common in children than adults. Each year thousands of people in the [place] die from flu, and many more are hospitalized. Flu vaccine prevents millions of illnesses and flu-related visits to the doctor each year. 2. Influenza vaccine CDC recommends everyone 57 months of age and older get vaccinated every flu season. Children 6 months through 2 years of age may need 2 doses during a single flu season. Everyone else needs only 1 dose each flu season. It takes about 2 weeks for protection to develop after vaccination. There are many flu viruses, and they are always changing. Each year a new flu vaccine is made to protect against three or four viruses that are likely to cause disease in the upcoming flu season. Even when the vaccine doesn't exactly match these viruses, it may still provide some protection. Influenza vaccine does not cause flu. Influenza vaccine may be given at the same time as other vaccines. 3. Talk with your health care provider Tell your vaccine provider if the person getting the vaccine:  Has had an  allergic reaction after a previous dose of influenza vaccine, or has any severe, life-threatening allergies.  Has ever had Guillain-Barr Syndrome (also called GBS). In some cases, your health care provider may decide to postpone influenza vaccination to a future visit. People with minor illnesses, such as a cold, may be vaccinated. People who are moderately or severely ill should usually wait until they recover before getting influenza vaccine. Your health care provider can give you more information. 4. Risks of a vaccine reaction  Soreness, redness, and swelling where shot is given, fever, muscle aches, and headache can happen after influenza vaccine.  There may be a very small increased risk of Guillain-Barr Syndrome (GBS) after inactivated influenza vaccine (the flu shot). Young children who get the flu shot along with pneumococcal vaccine (PCV13), and/or DTaP vaccine at the same time might be slightly more likely to have a seizure caused by fever. Tell your health care provider if a child who is getting flu vaccine has ever had a seizure. People sometimes faint after medical procedures, including vaccination. Tell your provider if you feel dizzy or have vision changes or ringing in the ears. As with any medicine, there is a very remote chance of a vaccine causing a severe allergic reaction, other serious injury, or death. 5. What if there is a serious problem? An allergic reaction could occur after the vaccinated person leaves the clinic. If you see signs of a severe allergic reaction (hives, swelling of the face and throat, difficulty breathing, a fast heartbeat, dizziness, or weakness), call 9-1-1 and get the person to the nearest hospital. For other signs that  concern you, call your health care provider. Adverse reactions should be reported to the Vaccine Adverse Event Reporting System (VAERS). Your health care provider will usually file this report, or you can do it yourself. Visit the  VAERS website at www.vaers.SamedayNews.es or call (220)470-2161.VAERS is only for reporting reactions, and VAERS staff do not give medical advice. 6. The National Vaccine Injury Compensation Program The [employer] Vaccine Injury Compensation Program (VICP) is a federal program that was created to compensate people who may have been injured by certain vaccines. Visit the VICP website at GoldCloset.com.ee or call (586)812-2150 to learn about the program and about filing a claim. There is a time limit to file a claim for compensation. 7. How can I learn more?  Ask your healthcare provider.  Call your local or state health department.  Contact the Centers for Disease Control and Prevention (CDC): ? Call 986-358-6525 (1-800-CDC-INFO) or ? Visit CDC's https://gibson.com/ Vaccine Information Statement (Interim) Inactivated Influenza Vaccine (08/15/2017) This information is not intended to replace advice given to you by your health care provider. Make sure you discuss any questions you have with your health care provider. Document Revised: 04/08/2018 Document Reviewed: 08/19/2017 Elsevier Patient Education  [place]. Fenofibrate capsules What is this medicine? FENOFIBRATE (fen oh FYE brate) capsules can help lower blood fats and cholesterol for people who are at risk of getting inflammation of the pancreas (pancreatitis) from having very high amounts of fats in their blood. This medicine is only for patients whose blood fats are not controlled by diet. This medicine may be used for other purposes; ask your health care provider or pharmacist if you have questions. COMMON BRAND NAME(S): Lipofen What should I tell my health care provider before I take this medicine? They need to know if you have any of these conditions:  gallbladder disease  heart disease  kidney disease  liver disease  an unusual or allergic reaction to fenofibrate, gemfibrozil, other medicines, foods, dyes, or  preservatives  pregnant or trying to get pregnant  breast-feeding How should I use this medicine? Take this medicine by mouth with a glass of water. Follow the directions on the prescription label. Take with food. Take your medicine at regular intervals. Do not take it more often than directed. Do not stop taking except on your doctor's advice. Talk to your pediatrician regarding the use of this medicine in children. Special care may be needed. Overdosage: If you think you have taken too much of this medicine contact a poison control center or emergency room at once. NOTE: This medicine is only for you. Do not share this medicine with others. What if I miss a dose? If you miss a dose, take it as soon as you can. If it is almost time for your next dose, take only that dose. Do not take double or extra doses. What may interact with this medicine? This medicine may interact with the following medications:  bile acid resins like cholestyramine, colesevelam, and colestipol  certain medicines for cholesterol like atorvastatin, lovastatin, and simvastatin  certain medicines for diabetes, like glipizide or glyburide  certain medicines that suppress the body's immune response like cyclosporine and tacrolimus  certain medicines that treat or prevent blood clots like warfarin  colchicine  ezetimibe  supplements like red yeast rice This list may not describe all possible interactions. Give your health care provider a list of all the medicines, herbs, non-prescription drugs, or dietary supplements you use. Also tell them if you smoke, drink alcohol, or  use illegal drugs. Some items may interact with your medicine. What should I watch for while using this medicine? Visit your doctor or healthcare provider for regular checks on your progress. Your blood fat levels and other tests will be measured from time to time. This medicine may cause serious skin reactions. They can happen weeks to months  after starting the medicine. Contact your healthcare provider right away if you notice fevers or flu-like symptoms with a rash. The rash may be red or purple and then turn into blisters or peeling of the skin. Or, you might notice a red rash with swelling of the face, lips, or lymph nodes in your neck or under your arms. This medicine is only part of a total cholesterol-lowering program. Your healthcare provider or dietician can suggest a low-cholesterol and low-fat diet that will reduce your risk of getting heart and blood vessel disease. Avoid alcohol and smoking, and keep a proper exercise schedule. If you are diabetic, close regulation and monitoring of your blood sugars can help your blood fat levels. This medicine may change the way your diabetic medicine works and sometimes will require that your dosages be adjusted. Check with your doctor or healthcare provider. This medicine can make you more sensitive to the sun. Keep out of the sun. If you cannot avoid being in the sun, wear protective clothing and use sunscreen. Do not use sun lamps or tanning beds/booths. You may get drowsy or dizzy. Do not drive, use machinery, or do anything that needs mental alertness until you know how this drug affects you. Do not stand or sit up quickly, especially if you are an older patient. This reduces the risk of dizzy or fainting spells. This medicine may cause a decrease in vitamin B12. You should make sure that you get enough vitamin B12 while you are taking this medicine. Discuss the foods you eat and the vitamins you take with your healthcare provider. What side effects may I notice from receiving this medicine? Side effects that you should report to your doctor or health care professional as soon as possible:  allergic reactions like skin rash, itching or hives, swelling of the face, lips, or tongue  blurred vision  bruising  rash, fever, and swollen lymph nodes  redness, blistering, peeling, or  loosening of the skin, including inside the mouth  signs and symptoms of infection like fever or chills, cough, or sore throat  signs and symptoms of muscle injury like dark urine, trouble passing urine or change in the amount of urine, unusually weak or tired, muscle pain, or side or back pain  stomach pain  yellowing of the eyes or skin Side effects that usually do not require medical attention (report to your doctor or health care professional if they continue or are bothersome):  constipation  diarrhea  dizziness  headache  nausea, vomiting This list may not describe all possible side effects. Call your doctor for medical advice about side effects. You may report side effects to FDA at 1-800-FDA-1088. Where should I keep my medicine? Keep out of the reach of children. Store at room temperature between 15 and 30 degrees C (59 and 86 degrees F). Keep container tightly closed. Throw away any unused medicine after the expiration date. NOTE: This sheet is a summary. It may not cover all possible information. If you have questions about this medicine, talk to your doctor, pharmacist, or health care provider.  2020 Elsevier/Gold Standard (2018-03-20 14:27:01)

## 2019-11-03 NOTE — Progress Notes (Signed)
Subjective:  Patient ID: Lee Hughes, male    DOB: 04-24-1960  Age: 59 y.o. MRN: [id number]  CC:  Chief Complaint  Patient presents with   Follow-up    Doing well   Hyperlipidemia   Depression   Prediabetes   Other    Vitamin D deficiency      HPI  This patient arrives today for the above.  Hyperlipidemia: Last lipid panel was collected approximately 4 months ago and did show total cholesterol level of 261, HDL 34, and triglycerides of 810.  LDLs were not able to be calculated due to elevated triglycerides.  He continues on omega-3 4 g/day.  He was told to try plant-based diet and participate in intermittent fasting.  Tells me although he has tried to increase his intake of whole foods implants, he is unable to go completely plant-based.  He tells me he has been evaluated by an endocrinologist in the past and had been on Gordon which seemed to really improve his elevated triglycerides, however he was unable to afford this because insurance did not cover it.  He has been on statin therapy in the past but was experiencing negative side effects so this was stopped.  Depression: He continues on Lexapro and tells me that he feels his mood is stable.  He denies any suicidal ideation.  Prediabetes: He has a history of prediabetes but last A1c was within the normal range.  Vitamin D deficiency: He has a history of vitamin D deficiency and continues on 10,000 IUs of vitamin D3 daily.  Last serum check was 33 and this was collected approximately 4 months ago.  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   Arthritis    bilateral fingers   Asthma    as child, no longer problematic   Bladder cancer ([place])    Status post surgery and immunotherapy 2018   Depression    Fatty liver    Hypercholesteremia    Irritable bowel syndrome    Nephrolithiasis    Prediabetes    Throat swelling    from tick   Tongue swelling    from  tick   Urticaria    from tick   Varicella zoster       Family History  Problem Relation Age of Onset   Pancreatic cancer Brother    Pancreatic disease Brother    Alcoholism Mother    Cirrhosis Mother    Alcoholism Father    Broken bones Father    Stroke Maternal Grandmother    Cancer Maternal Grandfather    Diabetes Maternal Grandfather    Emphysema Paternal Grandfather    Dementia Paternal Aunt    Dementia Paternal Uncle    Allergic rhinitis Neg Hx    Angioedema Neg Hx    Asthma Neg Hx    Atopy Neg Hx    Eczema Neg Hx    Immunodeficiency Neg Hx    Urticaria Neg Hx     Social History   Social History Narrative   Married 18 years. Lives with wife. 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   Caffeine- coffee 6 c daily   Social History   Tobacco Use   Smoking status: Never Smoker   Smokeless tobacco: Never Used  Substance Use Topics   Alcohol use: Yes    Comment: occas     Current Meds  Medication Sig   aspirin 81 MG chewable tablet Chew 81 mg  by mouth daily.    Cetirizine HCl (ZYRTEC ALLERGY) 10 MG CAPS Take by mouth 2 (two) times daily.    Cholecalciferol (VITAMIN D-3) 125 MCG (5000 UT) TABS Take 2 tablets by mouth daily.    escitalopram (LEXAPRO) 5 MG tablet Take 1 tablet (5 mg total) by mouth daily.   Multiple Vitamin (MULTIVITAMIN) capsule Take by mouth daily.    niacin (NIASPAN) 1000 MG CR tablet Take 1,500 mg by mouth 2 (two) times daily.    omega-3 acid ethyl esters (LOVAZA) 1 g capsule TAKE 2 CAPSULES BY MOUTH TWO TIMES DAILY   tamsulosin (FLOMAX) 0.4 MG CAPS capsule Take 1 capsule (0.4 mg total) by mouth daily.    ROS:  Review of Systems  Constitutional: Negative.   Respiratory: Negative.   Cardiovascular: Negative.   Neurological: Negative.      Objective:   Today's Vitals: BP 118/70    Pulse 62    Temp (!) 97.3 F (36.3 C) (Temporal)    Ht '5\' 11"'  (1.803 m)    Wt 196 lb 3.2 oz (89 kg)    SpO2 98%    BMI 27.36 kg/m  Vitals  with BMI 11/03/2019 07/28/2019 07/01/2019  Height '5\' 11"'  '5\' 11"'  '5\' 11"'   Weight 196 lbs 3 oz 195 lbs 193 lbs 13 oz  BMI 27.38 07.68 08.81  Systolic 103 159 458  Diastolic 70 75 69  Pulse 62 67 62     Physical Exam Vitals reviewed.  Constitutional:      Appearance: Normal appearance.  HENT:     Head: Normocephalic and atraumatic.  Cardiovascular:     Rate and Rhythm: Normal rate and regular rhythm.  Pulmonary:     Effort: Pulmonary effort is normal.     Breath sounds: Normal breath sounds.  Musculoskeletal:     Cervical back: Neck supple.  Skin:    General: Skin is warm and dry.  Neurological:     Mental Status: He is alert and oriented to person, place, and time.  Psychiatric:        Mood and Affect: Mood normal.        Behavior: Behavior normal.        Thought Content: Thought content normal.        Judgment: Judgment normal.          Assessment and Plan   1. Mixed hyperlipidemia   2. Prediabetes   3. Vitamin D deficiency disease   4. Need for influenza vaccination   5. Recurrent major depressive disorder, in partial remission ([place])      Plan: 1.  We will collect lipid panel again today for further evaluation.  We did discuss possible options including trying fenofibrate versus referral back to his old endocrinologist to discuss trying to get prior authorization for Repatha again.  Will await lipid panel results before making decision. 2.  We will collect A1c for further evaluation.  He was encouraged to continue following a healthy diet, and to try to eat mostly plant-based foods. 3.  We will check serum vitamin D level today. 4.  We will administer flu shot today. 5.  We will continue on Lexapro.   Tests ordered Orders Placed This Encounter  Procedures   Flu Vaccine QUAD 6+ mos PF IM (Fluarix Quad PF)   Lipid Panel   Hemoglobin A1c   Vitamin D, 25-hydroxy   CMP with eGFR([facility])      No orders of the defined types were placed in this  encounter.  Patient to follow-up in 6 weeks or sooner as needed.  Ailene Ards, NP

## 2019-11-04 LAB — COMPLETE METABOLIC PANEL WITH GFR
AG Ratio: 2 (calc) (ref 1.0–2.5)
ALT: 24 U/L (ref 9–46)
AST: 24 U/L (ref 10–35)
Albumin: 4.3 g/dL (ref 3.6–5.1)
Alkaline phosphatase (APISO): 77 U/L (ref 35–144)
BUN: 16 mg/dL (ref 7–25)
CO2: 28 mmol/L (ref 20–32)
Calcium: 9.6 mg/dL (ref 8.6–10.3)
Chloride: 104 mmol/L (ref 98–110)
Creat: 0.99 mg/dL (ref 0.70–1.33)
GFR, Est African American: 98 mL/min/{1.73_m2} (ref >=60)
GFR, Est Non African American: 84 mL/min/{1.73_m2} (ref >=60)
Globulin: 2.2 g/dL (calc) (ref 1.9–3.7)
Glucose, Bld: 96 mg/dL (ref 65–99)
Potassium: 4.5 mmol/L (ref 3.5–5.3)
Sodium: 139 mmol/L (ref 135–146)
Total Bilirubin: 0.5 mg/dL (ref 0.2–1.2)
Total Protein: 6.5 g/dL (ref 6.1–8.1)

## 2019-11-04 LAB — HEMOGLOBIN A1C
Hgb A1c MFr Bld: 5.5 % of total Hgb (ref <5.7)
Mean Plasma Glucose: 111 (calc)
eAG (mmol/L): 6.2 (calc)

## 2019-11-04 LAB — LIPID PANEL
Cholesterol: 245 mg/dL — ABNORMAL HIGH (ref <200)
HDL: 37 mg/dL — ABNORMAL LOW (ref >=40)
LDL Cholesterol (Calc): 167 mg/dL (calc) — ABNORMAL HIGH
Non-HDL Cholesterol (Calc): 208 mg/dL (calc) — ABNORMAL HIGH (ref <130)
Total CHOL/HDL Ratio: 6.6 (calc) — ABNORMAL HIGH (ref <5.0)
Triglycerides: 249 mg/dL — ABNORMAL HIGH (ref <150)

## 2019-11-04 LAB — VITAMIN D 25 HYDROXY (VIT D DEFICIENCY, FRACTURES): Vit D, 25-Hydroxy: 40 ng/mL (ref 30–100)

## 2019-11-05 ENCOUNTER — Other Ambulatory Visit (INDEPENDENT_AMBULATORY_CARE_PROVIDER_SITE_OTHER): Payer: Self-pay | Admitting: Internal Medicine

## 2019-11-05 ENCOUNTER — Other Ambulatory Visit (INDEPENDENT_AMBULATORY_CARE_PROVIDER_SITE_OTHER): Payer: Self-pay

## 2019-11-05 DIAGNOSIS — F3341 Major depressive disorder, recurrent, in partial remission: Secondary | ICD-10-CM

## 2019-11-05 MED ORDER — ESCITALOPRAM OXALATE 5 MG PO TABS: 5.0000 mg | ORAL_TABLET | Freq: Every day | ORAL | 1 refills | Status: DC

## 2019-11-05 MED FILL — ESCITALOPRAM 5 MG TABLET: 5 | 90 days supply | Qty: 90 | Fill #0

## 2019-11-06 ENCOUNTER — Ambulatory Visit: Payer: 59

## 2019-11-24 ENCOUNTER — Other Ambulatory Visit (INDEPENDENT_AMBULATORY_CARE_PROVIDER_SITE_OTHER): Payer: 59 | Admitting: General Surgery

## 2019-11-24 ENCOUNTER — Other Ambulatory Visit: Payer: Self-pay | Admitting: Family Medicine

## 2019-11-24 DIAGNOSIS — Z09 Encounter for follow-up examination after completed treatment for conditions other than malignant neoplasm: Secondary | ICD-10-CM

## 2019-11-24 MED ORDER — DOXYCYCLINE HYCLATE 50 MG PO CAPS
100.0000 mg | ORAL_CAPSULE | Freq: Two times a day (BID) | ORAL | 1 refills | Status: DC
Start: 2019-11-24 — End: 2019-12-23

## 2019-11-24 NOTE — Progress Notes (Signed)
Doxycycline 100mg  po bid x ten days for right lower leg cellulitis

## 2019-11-25 ENCOUNTER — Other Ambulatory Visit (INDEPENDENT_AMBULATORY_CARE_PROVIDER_SITE_OTHER): Payer: Self-pay | Admitting: Internal Medicine

## 2019-11-25 MED FILL — OMEGA-3-ACID ETHYL ESTERS 1: 1 | 30 days supply | Qty: 120 | Fill #0

## 2019-11-30 ENCOUNTER — Other Ambulatory Visit (INDEPENDENT_AMBULATORY_CARE_PROVIDER_SITE_OTHER): Payer: Self-pay

## 2019-11-30 MED ORDER — OMEGA-3-ACID ETHYL ESTERS 1 G PO CAPS
2.0000 g | ORAL_CAPSULE | Freq: Two times a day (BID) | ORAL | 3 refills | Status: DC
Start: 2019-11-30 — End: 2020-04-04

## 2019-12-02 DIAGNOSIS — M654 Radial styloid tenosynovitis [de Quervain]: Secondary | ICD-10-CM | POA: Diagnosis not present

## 2019-12-02 DIAGNOSIS — M65351 Trigger finger, right little finger: Secondary | ICD-10-CM | POA: Diagnosis not present

## 2019-12-02 DIAGNOSIS — G5601 Carpal tunnel syndrome, right upper limb: Secondary | ICD-10-CM | POA: Diagnosis not present

## 2019-12-14 ENCOUNTER — Other Ambulatory Visit: Payer: 59

## 2019-12-15 ENCOUNTER — Other Ambulatory Visit: Payer: 59

## 2019-12-15 ENCOUNTER — Other Ambulatory Visit: Payer: Self-pay

## 2019-12-15 DIAGNOSIS — R972 Elevated prostate specific antigen [PSA]: Secondary | ICD-10-CM | POA: Diagnosis not present

## 2019-12-16 DIAGNOSIS — G5601 Carpal tunnel syndrome, right upper limb: Secondary | ICD-10-CM

## 2019-12-16 HISTORY — DX: Carpal tunnel syndrome, right upper limb: G56.01

## 2019-12-16 LAB — PSA, TOTAL AND FREE
PSA, Free Pct: 12.3 %
PSA, Free: 0.37 ng/mL
Prostate Specific Ag, Serum: 3 ng/mL (ref 0.0–4.0)

## 2019-12-18 ENCOUNTER — Ambulatory Visit (INDEPENDENT_AMBULATORY_CARE_PROVIDER_SITE_OTHER): Payer: 59 | Admitting: Urology

## 2019-12-18 ENCOUNTER — Other Ambulatory Visit: Payer: Self-pay

## 2019-12-18 ENCOUNTER — Encounter: Payer: Self-pay | Admitting: Urology

## 2019-12-18 VITALS — BP 110/69 | HR 56 | Temp 98.8°F | Ht 71.0 in | Wt 196.0 lb

## 2019-12-18 DIAGNOSIS — N138 Other obstructive and reflux uropathy: Secondary | ICD-10-CM | POA: Diagnosis not present

## 2019-12-18 DIAGNOSIS — N5201 Erectile dysfunction due to arterial insufficiency: Secondary | ICD-10-CM | POA: Diagnosis not present

## 2019-12-18 DIAGNOSIS — R351 Nocturia: Secondary | ICD-10-CM

## 2019-12-18 DIAGNOSIS — N401 Enlarged prostate with lower urinary tract symptoms: Secondary | ICD-10-CM

## 2019-12-18 DIAGNOSIS — Z8551 Personal history of malignant neoplasm of bladder: Secondary | ICD-10-CM | POA: Diagnosis not present

## 2019-12-18 DIAGNOSIS — R972 Elevated prostate specific antigen [PSA]: Secondary | ICD-10-CM | POA: Diagnosis not present

## 2019-12-18 LAB — URINALYSIS, ROUTINE W REFLEX MICROSCOPIC
Bilirubin, UA: NEGATIVE
Glucose, UA: NEGATIVE
Ketones, UA: NEGATIVE
Leukocytes,UA: NEGATIVE
Nitrite, UA: NEGATIVE
Protein,UA: NEGATIVE
RBC, UA: NEGATIVE
Specific Gravity, UA: 1.025 (ref 1.005–1.030)
Urobilinogen, Ur: 0.2 mg/dL (ref 0.2–1.0)
pH, UA: 6 (ref 5.0–7.5)

## 2019-12-18 NOTE — Progress Notes (Signed)
Subjective:  1. History of bladder cancer   2. Elevated PSA   3. BPH with urinary obstruction   4. Nocturia   5. Erectile dysfunction due to arterial insufficiency     Lee Hughes returns today in f/u for his history of bladder cancer and returns for cystoscopy. He has had no hematuria. 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   He had BCG maintenance with the last treatment on 10/09/19.  His PSA is down to 3.0 on 12/15/19.  On 03/31/19 it was was 4.1 and it was 4.1 again on 07/01/19.  UA is clear today.   He had a TURBT of a 3cm tumor on 12/18/16 from the left lateral wall. It was multifocal Ta primarily low grade disease with some foci of high risk features. He completed induction BCG on 02/22/17    He has progressive ED but has not tried the sildenafil.       ROS:  ROS:  A complete review of systems was performed.  All systems are negative except for pertinent findings as noted.   Review of Systems  All other systems reviewed and are negative.   Allergies  Allergen Reactions  . No Known Allergies   . Other     No beef, pork, deer, products secondary from tick bite    Outpatient Encounter Medications as of 12/18/2019  Medication Sig  . aspirin 81 MG chewable tablet Chew 81 mg by mouth daily.   . cephALEXin (KEFLEX) 500 MG capsule cephalexin 500 mg capsule  TAKE ONE CAPSULE BY MOUTH FOUR TIMES DAILY FOR EIGHT DAYS BEGIN 2 DAYS BEFORE PROCEDURE AND CONTINUE AS DIRECTED UNTIL 5 DAYS POST-PROCEDURE/ UNTIL GONE  . Cetirizine HCl 10 MG CAPS Take by mouth 2 (two) times daily.   . Cholecalciferol (VITAMIN D-3) 125 MCG (5000 UT) TABS Take 2 tablets by mouth daily.   Marland Kitchen doxycycline (VIBRAMYCIN) 50 MG capsule Take 2 capsules (100 mg total) by mouth 2 (two) times daily.  Marland Kitchen escitalopram (LEXAPRO) 5 MG tablet Take 1 tablet (5 mg total) by mouth daily.  Marland Kitchen HYDROcodone-acetaminophen (NORCO/VICODIN) 5-325 MG tablet Take 1-2 tablets by mouth every 4 (four) hours as needed.  . Multiple  Vitamin (MULTIVITAMIN) capsule Take by mouth daily.   . niacin (NIASPAN) 1000 MG CR tablet Take 1,500 mg by mouth 2 (two) times daily.   Marland Kitchen omega-3 acid ethyl esters (LOVAZA) 1 g capsule Take 2 capsules (2 g total) by mouth 2 (two) times daily.  . tamsulosin (FLOMAX) 0.4 MG CAPS capsule Take 1 capsule (0.4 mg total) by mouth daily.  . [DISCONTINUED] omega-3 acid ethyl esters (LOVAZA) 1 g capsule TAKE 2 CAPSULES BY MOUTH TWO TIMES DAILY   No facility-administered encounter medications on file as of 12/18/2019.    Past Medical History:  Diagnosis Date  . 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 Arthritis    bilateral fingers  . Asthma    as child, no longer problematic  . Bladder cancer [facility])    Status post surgery and immunotherapy 2018  . Depression   . Fatty liver   . Hypercholesteremia   . Irritable bowel syndrome   . Nephrolithiasis   . Prediabetes   . Throat swelling    from tick  . Tongue swelling    from tick  . Urticaria    from tick  . Varicella zoster     Past Surgical History:  Procedure Laterality Date  . COLONOSCOPY N/A 09/09/2012  Procedure: COLONOSCOPY;  Surgeon: Jamesetta So, MD;  Location: [facility];  Service: Gastroenterology;  Laterality: N/A;  . Left femur Left 1988  . TRANSURETHRAL RESECTION OF BLADDER TUMOR N/A 12/18/2016   Procedure: CYSTOSCOPY TRANSURETHRAL RESECTION OF BLADDER TUMOR (TURBT);  Surgeon: [name], MD;  Location: [facility];  Service: Urology;  Laterality: N/A;    Social History   Socioeconomic History  . Marital status: Married    Spouse name: [name]  . Number of children: Not on file  . Years of education: Not on file  . Highest education level: Associate degree: academic program  Occupational History  . Not on file  Tobacco Use  . Smoking status: Never Smoker  . Smokeless tobacco: Never Used  Vaping Use  . Vaping Use: Never used  Substance and Sexual Activity  . Alcohol use: Yes     Comment: occas  . Drug use: Never  . Sexual activity: Not Currently  Other Topics Concern  . Not on file  Social History Narrative   Married 18 years. Lives with wife. 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   Caffeine- coffee 6 c daily   Social Determinants of Health   Financial Resource Strain: Not on file  Food Insecurity: Not on file  Transportation Needs: Not on file  Physical Activity: Not on file  Stress: Not on file  Social Connections: Not on file  Intimate Partner Violence: Not on file    Family History  Problem Relation Age of Onset  . Pancreatic cancer Brother   . Pancreatic disease Brother   . Alcoholism Mother   . Cirrhosis Mother   . Alcoholism Father   . Broken bones Father   . Stroke Maternal Grandmother   . Cancer Maternal Grandfather   . Diabetes Maternal Grandfather   . Emphysema Paternal Grandfather   . Dementia Paternal Aunt   . Dementia Paternal Uncle   . Allergic rhinitis Neg Hx   . Angioedema Neg Hx   . Asthma Neg Hx   . Atopy Neg Hx   . Eczema Neg Hx   . Immunodeficiency Neg Hx   . Urticaria Neg Hx        Objective: Vitals:   12/18/19 1544  BP: 110/69  Pulse: (!) 56  Temp: 98.8 F (37.1 C)     Physical Exam  Lab Results:  No results found for this or any previous visit (from the past 24 hour(s)).  BMET No results for input(s): NA, K, CL, CO2, GLUCOSE, BUN, CREATININE, CALCIUM in the last 72 hours. PSA PSA  Date Value Ref Range Status  03/19/2019 4.1 (H) < OR = 4.0 ng/mL Final    Comment:    The total PSA value from this assay system is  standardized against the WHO standard. The test  result will be approximately 20% lower when compared  to the equimolar-standardized total PSA (Beckman  Coulter). Comparison of serial PSA results should be  interpreted with this fact in mind. . This test was performed using the Siemens  chemiluminescent method. Values obtained from  different assay methods cannot be used interchangeably. PSA  levels, regardless of value, should not be interpreted as absolute evidence of the presence or absence of disease.    Prostate Specific Ag, Serum  Date Value Ref Range Status  12/15/2019 3.0 0.0 - 4.0 ng/mL Final    Comment:    Roche ECLIA methodology. According to the American Urological Association, Serum PSA should decrease and remain at undetectable levels  after radical prostatectomy. The AUA defines biochemical recurrence as an initial PSA value 0.2 ng/mL or greater followed by a subsequent confirmatory PSA value 0.2 ng/mL or greater. Values obtained with different assay methods or kits cannot be used interchangeably. Results cannot be interpreted as absolute evidence of the presence or absence of malignant disease.     No results found for: TESTOSTERONE  Lab Results  Component Value Date   PSA1 3.0 12/15/2019     Studies/Results: Cystoscopy:   He was prepped with betadine, draped with sterile towels and the urethra was instilled with 2% lidocaine.  He is on Keflex for recent hand surgery.   Urethra: normal.  Prostate: 3cm with bilobar hyperplasia with obstruction.  No middle lobe.  Bladder: Mild trabeculation without tumors or stones but there is some patchy erythema on the posterior wall and dome that appears most consistent with BCG effect.   Ureteral orifices: normal.  Complications: none.       Assessment & Plan: 1. History of bladder cancer.   He has what are probably post BCG changes on the bladder wall today but I will get cytology and have him return in 6 months for cystoscopy.   2. BPH with BOO and mild LUTS.   He will continue the tamsulosin.  His PSA is back down to 3.0.     3. ED.  He has not tried the PDE5's.   No orders of the defined types were placed in this encounter.    Orders Placed This Encounter  Procedures  . Urinalysis, Routine w reflex microscopic      Return in about 6 months (around 06/17/2020) for cystoscopy.   CC: Doree Albee, MD      [name] 12/18/2019

## 2019-12-18 NOTE — Progress Notes (Signed)

## 2019-12-23 ENCOUNTER — Other Ambulatory Visit: Payer: Self-pay

## 2019-12-23 ENCOUNTER — Ambulatory Visit (INDEPENDENT_AMBULATORY_CARE_PROVIDER_SITE_OTHER): Payer: 59 | Admitting: Nurse Practitioner

## 2019-12-23 ENCOUNTER — Encounter (INDEPENDENT_AMBULATORY_CARE_PROVIDER_SITE_OTHER): Payer: Self-pay | Admitting: Nurse Practitioner

## 2019-12-23 VITALS — BP 124/80 | HR 62 | Temp 97.2°F | Ht 71.0 in | Wt 198.6 lb

## 2019-12-23 DIAGNOSIS — E785 Hyperlipidemia, unspecified: Secondary | ICD-10-CM | POA: Diagnosis not present

## 2019-12-23 DIAGNOSIS — F3341 Major depressive disorder, recurrent, in partial remission: Secondary | ICD-10-CM

## 2019-12-23 DIAGNOSIS — E559 Vitamin D deficiency, unspecified: Secondary | ICD-10-CM

## 2019-12-23 DIAGNOSIS — R7303 Prediabetes: Secondary | ICD-10-CM | POA: Diagnosis not present

## 2019-12-23 NOTE — Progress Notes (Signed)
Subjective:  Patient ID: Lee Hughes, male    DOB: [birth year]  Age: 59 y.o. MRN: [id number]  CC:  Chief Complaint  Patient presents with  . Follow-up    Doing okay, had his carpal tunnel surgery and stitches will come out next  . Depression  . Hyperlipidemia  . Prediabetes  . Other    Vitamin D deficiency      HPI  This patient arrives today for the above.  Depression: Continues on Lexapro and tells me his mood is stable.  Hyperlipidemia: Last lipid panel showed triglycerides dropped from a 10 down to 249.  LDL still above goal at 167.  He continues on niacin and omega-3.  He has been trying to focus on lifestyle and has changed his diet to reduce his intake of bread and eggs.  Prediabetes: Last A1c was 5.5.  Vitamin D deficiency: Continues on vitamin D3 supplement.  Past Medical History:  Diagnosis Date  . 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 Arthritis    bilateral fingers  . Asthma    as child, no longer problematic  . Bladder cancer [facility])    Status post surgery and immunotherapy 2018  . Carpal tunnel syndrome on right 12/16/2019  . Depression   . Fatty liver   . Hypercholesteremia   . Irritable bowel syndrome   . Nephrolithiasis   . Prediabetes   . Throat swelling    from tick  . Tongue swelling    from tick  . Urticaria    from tick  . Varicella zoster       Family History  Problem Relation Age of Onset  . Pancreatic cancer Brother   . Pancreatic disease Brother   . Alcoholism Mother   . Cirrhosis Mother   . Alcoholism Father   . Broken bones Father   . Stroke Maternal Grandmother   . Cancer Maternal Grandfather   . Diabetes Maternal Grandfather   . Emphysema Paternal Grandfather   . Dementia Paternal Aunt   . Dementia Paternal Uncle   . Allergic rhinitis Neg Hx   . Angioedema Neg Hx   . Asthma Neg Hx   . Atopy Neg Hx   . Eczema Neg Hx   . Immunodeficiency Neg Hx   . Urticaria Neg Hx     Social History   Social  History Narrative   Married 18 years. Lives with wife. 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   Caffeine- coffee 6 c daily   Social History   Tobacco Use  . Smoking status: Never Smoker  . Smokeless tobacco: Never Used  Substance Use Topics  . Alcohol use: Yes    Comment: occas     Current Meds  Medication Sig  . aspirin 81 MG chewable tablet Chew 81 mg by mouth daily.   . cephALEXin (KEFLEX) 500 MG capsule cephalexin 500 mg capsule  TAKE ONE CAPSULE BY MOUTH FOUR TIMES DAILY FOR EIGHT DAYS BEGIN 2 DAYS BEFORE PROCEDURE AND CONTINUE AS DIRECTED UNTIL 5 DAYS POST-PROCEDURE/ UNTIL GONE  . Cetirizine HCl 10 MG CAPS Take by mouth 2 (two) times daily.   . Cholecalciferol (VITAMIN D-3) 125 MCG (5000 UT) TABS Take 2 tablets by mouth daily.   Marland Kitchen escitalopram (LEXAPRO) 5 MG tablet Take 1 tablet (5 mg total) by mouth daily.  Marland Kitchen HYDROcodone-acetaminophen (NORCO/VICODIN) 5-325 MG tablet Take 1-2 tablets by mouth every 4 (four) hours as needed.  . Multiple Vitamin (MULTIVITAMIN) capsule Take by  mouth daily.   . niacin (NIASPAN) 1000 MG CR tablet Take 1,500 mg by mouth 2 (two) times daily.   Marland Kitchen omega-3 acid ethyl esters (LOVAZA) 1 g capsule Take 2 capsules (2 g total) by mouth 2 (two) times daily.  . tamsulosin (FLOMAX) 0.4 MG CAPS capsule Take 1 capsule (0.4 mg total) by mouth daily.    ROS:  Review of Systems  Constitutional: Negative for fever.  Respiratory: Negative for cough and shortness of breath.   Cardiovascular: Negative for chest pain and palpitations.  Neurological: Negative for dizziness and headaches.     Objective:   Today's Vitals: BP 124/80   Pulse 62   Temp (!) 97.2 F (36.2 C) (Temporal)   Ht 5\' 11"  (1.803 m)   Wt 198 lb 9.6 oz (90.1 kg)   SpO2 98%   BMI 27.70 kg/m  Vitals with BMI 12/23/2019 12/18/2019 11/03/2019  Height 5\' 11"  5\' 11"  5\' 11"   Weight 198 lbs 10 oz 196 lbs 196 lbs 3 oz  BMI 27.71 71.06 26.94  Systolic 854 627 035  Diastolic 80 69 70  Pulse 62 56 62      Physical Exam Vitals reviewed.  Constitutional:      Appearance: Normal appearance.  HENT:     Head: Normocephalic and atraumatic.  Cardiovascular:     Rate and Rhythm: Normal rate and regular rhythm.  Pulmonary:     Effort: Pulmonary effort is normal.     Breath sounds: Normal breath sounds.  Musculoskeletal:     Cervical back: Neck supple.  Skin:    General: Skin is warm and dry.  Neurological:     Mental Status: He is alert and oriented to person, place, and time.  Psychiatric:        Mood and Affect: Mood normal.        Behavior: Behavior normal.        Thought Content: Thought content normal.        Judgment: Judgment normal.          Assessment and Plan   1. Vitamin D deficiency disease   2. Recurrent major depressive disorder, in partial remission ([place])   3. Prediabetes   4. Hyperlipidemia, unspecified hyperlipidemia type      Plan: 1.  He will continue taking his vitamin D3 supplement as currently prescribed. 2.  He will continue taking his Lexapro as currently prescribed. 3.  Very well controlled currently with lifestyle.  He will continue to try to eat a healthy diet. 4.  Lipid panel not completely at goal.  We did discuss that we could consider restarting statin therapy, however has not tolerated atorvastatin well in the past.  We did discuss that it does provide some reduction in risk of heart attack or stroke, however patient would prefer not to restart statin therapy if possible.  For now he will just focus on lifestyle management and try to continue to eat a healthy diet.   Tests ordered No orders of the defined types were placed in this encounter.     No orders of the defined types were placed in this encounter.   Patient to follow-up in 6 months for annual physical exam, or sooner as needed.  Ailene Ards, NP

## 2019-12-28 MED FILL — OMEGA-3-ACID ETHYL ESTERS 1: 1 | 30 days supply | Qty: 120 | Fill #1

## 2019-12-30 DIAGNOSIS — G5601 Carpal tunnel syndrome, right upper limb: Secondary | ICD-10-CM | POA: Diagnosis not present

## 2020-01-29 MED FILL — OMEGA-3-ACID ETHYL ESTERS 1: 1 | 30 days supply | Qty: 120 | Fill #2

## 2020-02-01 MED FILL — TAMSULOSIN HCL 0.4 MG CAP: 0.4 | 90 days supply | Qty: 90 | Fill #1

## 2020-02-01 MED FILL — ESCITALOPRAM 5 MG TABLET: 5 | 90 days supply | Qty: 90 | Fill #1

## 2020-03-04 MED FILL — OMEGA-3-ACID ETHYL ESTERS 1: 1 | 30 days supply | Qty: 120 | Fill #3

## 2020-03-24 ENCOUNTER — Other Ambulatory Visit (HOSPITAL_BASED_OUTPATIENT_CLINIC_OR_DEPARTMENT_OTHER): Payer: Self-pay

## 2020-03-25 ENCOUNTER — Other Ambulatory Visit (HOSPITAL_BASED_OUTPATIENT_CLINIC_OR_DEPARTMENT_OTHER): Payer: Self-pay

## 2020-04-04 ENCOUNTER — Other Ambulatory Visit (HOSPITAL_COMMUNITY): Payer: Self-pay

## 2020-04-04 ENCOUNTER — Other Ambulatory Visit (INDEPENDENT_AMBULATORY_CARE_PROVIDER_SITE_OTHER): Payer: Self-pay | Admitting: Internal Medicine

## 2020-04-04 ENCOUNTER — Other Ambulatory Visit (HOSPITAL_BASED_OUTPATIENT_CLINIC_OR_DEPARTMENT_OTHER): Payer: Self-pay

## 2020-04-04 MED ORDER — OMEGA-3-ACID ETHYL ESTERS 1 G PO CAPS
2.0000 g | ORAL_CAPSULE | Freq: Two times a day (BID) | ORAL | 3 refills | Status: DC
  Filled 2020-04-04: qty 120, 30d supply, fill #0
  Filled 2020-05-11: qty 120, 30d supply, fill #1
  Filled 2020-06-13: qty 120, 30d supply, fill #2
  Filled 2020-07-11: qty 120, 30d supply, fill #3

## 2020-04-07 ENCOUNTER — Other Ambulatory Visit (HOSPITAL_COMMUNITY): Payer: Self-pay

## 2020-04-08 ENCOUNTER — Other Ambulatory Visit (HOSPITAL_COMMUNITY): Payer: Self-pay

## 2020-04-08 ENCOUNTER — Other Ambulatory Visit (HOSPITAL_BASED_OUTPATIENT_CLINIC_OR_DEPARTMENT_OTHER): Payer: Self-pay

## 2020-04-30 ENCOUNTER — Other Ambulatory Visit (INDEPENDENT_AMBULATORY_CARE_PROVIDER_SITE_OTHER): Payer: Self-pay | Admitting: Internal Medicine

## 2020-04-30 ENCOUNTER — Other Ambulatory Visit (HOSPITAL_COMMUNITY): Payer: Self-pay

## 2020-04-30 DIAGNOSIS — F3341 Major depressive disorder, recurrent, in partial remission: Secondary | ICD-10-CM

## 2020-04-30 MED FILL — Tamsulosin HCl Cap 0.4 MG: ORAL | 90 days supply | Qty: 90 | Fill #0 | Status: AC

## 2020-05-02 ENCOUNTER — Other Ambulatory Visit (HOSPITAL_COMMUNITY): Payer: Self-pay

## 2020-05-02 MED ORDER — ESCITALOPRAM OXALATE 5 MG PO TABS
ORAL_TABLET | Freq: Every day | ORAL | 1 refills | Status: DC
  Filled 2020-05-02: qty 90, 90d supply, fill #0
  Filled 2020-08-02: qty 90, 90d supply, fill #1

## 2020-05-04 ENCOUNTER — Other Ambulatory Visit (HOSPITAL_COMMUNITY): Payer: Self-pay

## 2020-05-11 ENCOUNTER — Other Ambulatory Visit (HOSPITAL_COMMUNITY): Payer: Self-pay

## 2020-06-13 ENCOUNTER — Other Ambulatory Visit (HOSPITAL_COMMUNITY): Payer: Self-pay

## 2020-06-16 ENCOUNTER — Other Ambulatory Visit: Payer: 59 | Admitting: Urology

## 2020-07-07 ENCOUNTER — Other Ambulatory Visit: Payer: 59 | Admitting: Urology

## 2020-07-11 ENCOUNTER — Other Ambulatory Visit (HOSPITAL_COMMUNITY): Payer: Self-pay

## 2020-07-12 ENCOUNTER — Ambulatory Visit (INDEPENDENT_AMBULATORY_CARE_PROVIDER_SITE_OTHER): Payer: 59 | Admitting: Internal Medicine

## 2020-07-12 ENCOUNTER — Other Ambulatory Visit: Payer: Self-pay

## 2020-07-12 ENCOUNTER — Encounter (INDEPENDENT_AMBULATORY_CARE_PROVIDER_SITE_OTHER): Payer: Self-pay | Admitting: Internal Medicine

## 2020-07-12 VITALS — BP 118/65 | HR 51 | Temp 97.5°F | Resp 18 | Ht 71.0 in | Wt 198.0 lb

## 2020-07-12 DIAGNOSIS — Z0001 Encounter for general adult medical examination with abnormal findings: Secondary | ICD-10-CM

## 2020-07-12 DIAGNOSIS — E785 Hyperlipidemia, unspecified: Secondary | ICD-10-CM | POA: Diagnosis not present

## 2020-07-12 DIAGNOSIS — R208 Other disturbances of skin sensation: Secondary | ICD-10-CM

## 2020-07-12 DIAGNOSIS — R7303 Prediabetes: Secondary | ICD-10-CM

## 2020-07-12 DIAGNOSIS — E559 Vitamin D deficiency, unspecified: Secondary | ICD-10-CM | POA: Diagnosis not present

## 2020-07-12 DIAGNOSIS — R972 Elevated prostate specific antigen [PSA]: Secondary | ICD-10-CM

## 2020-07-12 DIAGNOSIS — R200 Anesthesia of skin: Secondary | ICD-10-CM

## 2020-07-12 NOTE — Progress Notes (Signed)
Chief Complaint: This pleasant 60 year old man comes in for an annual physical exam. HPI: He has a history of dyslipidemia with especially high triglycerides, elevated PSA, vitamin D deficiency and a history of bladder cancer which has been treated by urology. Overall, he is doing well but he expresses numbness in the posterior aspect of his left foot just proximal to the all his toes.  This has been present for the last 2 months or so.  He did not feel that it is getting worse.  He has not had any injury.  He thinks it is related to his work. He denies any chest pain, dyspnea, palpitations or limb weakness.  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   Arthritis    bilateral fingers   Asthma    as child, no longer problematic   Bladder cancer ([place])    Status post surgery and immunotherapy 2018   Carpal tunnel syndrome on right 12/16/2019   Depression    Fatty liver    Hypercholesteremia    Irritable bowel syndrome    Nephrolithiasis    Prediabetes    Throat swelling    from tick   Tongue swelling    from tick   Urticaria    from tick   Varicella zoster    Past Surgical History:  Procedure Laterality Date   COLONOSCOPY N/A 09/09/2012   Procedure: COLONOSCOPY;  Surgeon: Jamesetta So, MD;  [facility];  Service: Gastroenterology;  Laterality: N/A;   Left femur Left 1988   TRANSURETHRAL RESECTION OF BLADDER TUMOR N/A 12/18/2016   Procedure: CYSTOSCOPY TRANSURETHRAL RESECTION OF BLADDER TUMOR (TURBT);  Surgeon: [name], MD;  Location: [facility];  Service: Urology;  Laterality: N/A;     Social History   Social History Narrative   Married 19 years. Lives with wife. 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    Social History   Tobacco Use   Smoking status: Never   Smokeless tobacco: Never  Substance Use Topics   Alcohol use: Yes    Comment: occas      Allergies:  Allergies  Allergen Reactions   No Known  Allergies    Other     No beef, pork, deer, products secondary from tick bite     Current Meds  Medication Sig   Cetirizine HCl 10 MG CAPS Take by mouth 2 (two) times daily.    Cholecalciferol (VITAMIN D-3) 125 MCG (5000 UT) TABS Take 2 tablets by mouth daily.    escitalopram (LEXAPRO) 5 MG tablet TAKE 1 TABLET BY MOUTH DAILY.   Multiple Vitamin (MULTIVITAMIN) capsule Take by mouth daily.    niacin (NIASPAN) 1000 MG CR tablet Take 1,500 mg by mouth 2 (two) times daily.    omega-3 acid ethyl esters (LOVAZA) 1 g capsule Take 2 capsules (2 g total) by mouth 2 (two) times daily.   tamsulosin (FLOMAX) 0.4 MG CAPS capsule TAKE 1 CAPSULE BY MOUTH ONCE DAILY   [DISCONTINUED] aspirin 81 MG chewable tablet Chew 81 mg by mouth daily.      [facility] Office Visit from 12/23/2019 in [place]  PHQ-9 Total Score 0       CVE:LFYBO from the symptoms mentioned above,there are no other symptoms referable to all systems reviewed.       Physical Exam:CMA Chaperone present Blood pressure 118/65, pulse (!) 51, temperature (!) 97.5 F (36.4 C), temperature source Temporal, resp. rate  18, height 5\' 11"  (1.803 m), weight 198 lb (89.8 kg), SpO2 98 %. Vitals with BMI 07/12/2020 12/23/2019 12/18/2019  Height 5\' 11"  5\' 11"  5\' 11"   Weight 198 lbs 198 lbs 10 oz 196 lbs  BMI 27.63 91.63 84.66  Systolic 599 357 017  Diastolic 65 80 69  Pulse 51 62 56      He looks systemically well.  Slightly overweight.  Blood pressure is excellent. General: Alert, cooperative, and appears to be the stated age.No pallor.  No jaundice.  No clubbing. Head: Normocephalic Eyes: Sclera white, pupils equal and reactive to light, red reflex x 2,  Ears: Normal bilaterally Oral cavity: Lips, mucosa, and tongue normal: Teeth and gums normal Neck: No adenopathy, supple, symmetrical, trachea midline, and thyroid does not appear enlarged Respiratory: Clear to auscultation bilaterally.No wheezing, crackles or  bronchial breathing. Cardiovascular: Heart sounds are present and appear to be normal without murmurs or added sounds.  No carotid bruits.  Peripheral pulses are present and equal bilaterally.: Gastrointestinal:positive bowel sounds, no hepatosplenomegaly.  No masses felt.No tenderness. Skin: Clear, No rashes noted.No worrisome skin lesions seen. Neurological: Grossly intact without focal findings, cranial nerves II through XII intact, muscle strength equal bilaterally.  In particular, there is no obvious sensory disturbance in the inferior aspect of the left foot. Musculoskeletal: No acute joint abnormalities noted.Full range of movement noted with joints. Psychiatric: Affect appropriate, non-anxious.    Assessment  1. Encounter for general adult medical examination with abnormal findings   2. Numbness of left foot   3. Hyperlipidemia, unspecified hyperlipidemia type   4. Prediabetes   5. Vitamin D deficiency disease   6. Elevated PSA     Tests Ordered:   Orders Placed This Encounter  Procedures   CBC   COMPLETE METABOLIC PANEL WITH GFR   Hemoglobin A1c   Lipid panel   T3, free   T4, free   TSH   VITAMIN D 25 Hydroxy (Vit-D Deficiency, Fractures)   PSA, Total with Reflex to PSA, Free      Plan  1.Relatively healthy 60 year old man with dyslipidemia but no history of coronary artery disease or cerebrovascular disease.  He also has vitamin D deficiency. 2.  Continue for the time being with niacin and Lovaza and we will check a lipid panel. 3.  Continue vitamin D3 supplementation for vitamin D deficiency and we will check levels today. 4.  Check PSA as he has not had one for over 6 months. 5.  Other blood work is ordered. 6.  Tdap vaccination was given today.  He is overdue. 7.  An ECG was done in the office which shows normal sinus rhythm with bradycardia without any acute ST-T wave changes. 8.  We discussed nutrition again regarding his dyslipidemia and the concept of  intermittent fasting combined with a plant-based diet.  He will try to do this. 9.  Further recommendations will depend on blood results and he will follow-up with Sarah in about 3 months. 10.  Today, in addition to a preventative visit, I performed an office visit to address his dyslipidemia, vitamin D deficiency, elevated PSA.      No orders of the defined types were placed in this encounter.    Londell Noll C Deontrae Drinkard   07/12/2020, 8:28 AM

## 2020-07-13 LAB — COMPLETE METABOLIC PANEL WITH GFR
AG Ratio: 1.8 (calc) (ref 1.0–2.5)
ALT: 55 U/L — ABNORMAL HIGH (ref 9–46)
AST: 35 U/L (ref 10–35)
Albumin: 4.3 g/dL (ref 3.6–5.1)
Alkaline phosphatase (APISO): 82 U/L (ref 35–144)
BUN: 16 mg/dL (ref 7–25)
CO2: 27 mmol/L (ref 20–32)
Calcium: 9.8 mg/dL (ref 8.6–10.3)
Chloride: 104 mmol/L (ref 98–110)
Creat: 0.99 mg/dL (ref 0.70–1.30)
Globulin: 2.4 g/dL (calc) (ref 1.9–3.7)
Glucose, Bld: 90 mg/dL (ref 65–99)
Potassium: 4.6 mmol/L (ref 3.5–5.3)
Sodium: 139 mmol/L (ref 135–146)
Total Bilirubin: 0.5 mg/dL (ref 0.2–1.2)
Total Protein: 6.7 g/dL (ref 6.1–8.1)
eGFR: 88 mL/min/{1.73_m2} (ref >=60)

## 2020-07-13 LAB — CBC
HCT: 47.5 % (ref 38.5–50.0)
Hemoglobin: 15.3 g/dL (ref 13.2–17.1)
MCH: 26.2 pg — ABNORMAL LOW (ref 27.0–33.0)
MCHC: 32.2 g/dL (ref 32.0–36.0)
MCV: 81.5 fL (ref 80.0–100.0)
MPV: 10.1 fL (ref 7.5–12.5)
Platelets: 207 10*3/uL (ref 140–400)
RBC: 5.83 10*6/uL — ABNORMAL HIGH (ref 4.20–5.80)
RDW: 14.1 % (ref 11.0–15.0)
WBC: 4.2 10*3/uL (ref 3.8–10.8)

## 2020-07-13 LAB — HEMOGLOBIN A1C
Hgb A1c MFr Bld: 5.4 % of total Hgb (ref <5.7)
Mean Plasma Glucose: 108 mg/dL
eAG (mmol/L): 6 mmol/L

## 2020-07-13 LAB — TSH: TSH: 2.28 mIU/L (ref 0.40–4.50)

## 2020-07-13 LAB — LIPID PANEL
Cholesterol: 290 mg/dL — ABNORMAL HIGH (ref <200)
HDL: 41 mg/dL (ref >=40)
LDL Cholesterol (Calc): 192 mg/dL (calc) — ABNORMAL HIGH
Non-HDL Cholesterol (Calc): 249 mg/dL (calc) — ABNORMAL HIGH (ref <130)
Total CHOL/HDL Ratio: 7.1 (calc) — ABNORMAL HIGH (ref <5.0)
Triglycerides: 338 mg/dL — ABNORMAL HIGH (ref <150)

## 2020-07-13 LAB — T4, FREE: Free T4: 1 ng/dL (ref 0.8–1.8)

## 2020-07-13 LAB — PSA, TOTAL WITH REFLEX TO PSA, FREE: PSA, Total: 1.6 ng/mL (ref <=4.0)

## 2020-07-13 LAB — VITAMIN D 25 HYDROXY (VIT D DEFICIENCY, FRACTURES): Vit D, 25-Hydroxy: 52 ng/mL (ref 30–100)

## 2020-07-13 LAB — T3, FREE: T3, Free: 3.3 pg/mL (ref 2.3–4.2)

## 2020-08-02 ENCOUNTER — Other Ambulatory Visit (HOSPITAL_COMMUNITY): Payer: Self-pay

## 2020-08-02 MED FILL — Tamsulosin HCl Cap 0.4 MG: ORAL | 90 days supply | Qty: 90 | Fill #1 | Status: AC

## 2020-08-04 DIAGNOSIS — H52223 Regular astigmatism, bilateral: Secondary | ICD-10-CM | POA: Diagnosis not present

## 2020-08-04 DIAGNOSIS — H5203 Hypermetropia, bilateral: Secondary | ICD-10-CM | POA: Diagnosis not present

## 2020-08-04 DIAGNOSIS — H524 Presbyopia: Secondary | ICD-10-CM | POA: Diagnosis not present

## 2020-08-12 ENCOUNTER — Other Ambulatory Visit (INDEPENDENT_AMBULATORY_CARE_PROVIDER_SITE_OTHER): Payer: Self-pay | Admitting: Internal Medicine

## 2020-08-13 ENCOUNTER — Other Ambulatory Visit (HOSPITAL_COMMUNITY): Payer: Self-pay

## 2020-08-15 ENCOUNTER — Other Ambulatory Visit (INDEPENDENT_AMBULATORY_CARE_PROVIDER_SITE_OTHER): Payer: Self-pay | Admitting: Internal Medicine

## 2020-08-15 ENCOUNTER — Other Ambulatory Visit (HOSPITAL_COMMUNITY): Payer: Self-pay

## 2020-08-17 ENCOUNTER — Other Ambulatory Visit (INDEPENDENT_AMBULATORY_CARE_PROVIDER_SITE_OTHER): Payer: Self-pay | Admitting: Internal Medicine

## 2020-08-17 ENCOUNTER — Other Ambulatory Visit (HOSPITAL_COMMUNITY): Payer: Self-pay

## 2020-08-19 ENCOUNTER — Other Ambulatory Visit (HOSPITAL_COMMUNITY): Payer: Self-pay

## 2020-08-19 ENCOUNTER — Other Ambulatory Visit (INDEPENDENT_AMBULATORY_CARE_PROVIDER_SITE_OTHER): Payer: Self-pay | Admitting: Internal Medicine

## 2020-08-20 ENCOUNTER — Other Ambulatory Visit (HOSPITAL_COMMUNITY): Payer: Self-pay

## 2020-08-20 MED ORDER — OMEGA-3-ACID ETHYL ESTERS 1 G PO CAPS
2.0000 g | ORAL_CAPSULE | Freq: Two times a day (BID) | ORAL | 3 refills | Status: AC
  Filled 2020-08-20: qty 120, 30d supply, fill #0
  Filled 2020-09-27: qty 120, 30d supply, fill #1
  Filled 2020-10-28: qty 120, 30d supply, fill #2
  Filled 2020-12-01: qty 120, 30d supply, fill #3

## 2020-08-22 ENCOUNTER — Other Ambulatory Visit (INDEPENDENT_AMBULATORY_CARE_PROVIDER_SITE_OTHER): Payer: Self-pay

## 2020-08-22 ENCOUNTER — Other Ambulatory Visit (HOSPITAL_COMMUNITY): Payer: Self-pay

## 2020-08-22 DIAGNOSIS — F3341 Major depressive disorder, recurrent, in partial remission: Secondary | ICD-10-CM

## 2020-08-22 MED ORDER — ESCITALOPRAM OXALATE 5 MG PO TABS: ORAL_TABLET | Freq: Every day | ORAL | 0 refills | Status: DC

## 2020-08-24 ENCOUNTER — Other Ambulatory Visit (HOSPITAL_COMMUNITY): Payer: Self-pay

## 2020-08-24 ENCOUNTER — Other Ambulatory Visit: Payer: Self-pay

## 2020-08-24 MED ORDER — TAMSULOSIN HCL 0.4 MG PO CAPS
ORAL_CAPSULE | Freq: Every day | ORAL | 3 refills | Status: DC
  Filled 2020-08-24 – 2020-11-04 (×2): qty 90, 90d supply, fill #0

## 2020-09-01 ENCOUNTER — Other Ambulatory Visit: Payer: Self-pay

## 2020-09-01 ENCOUNTER — Ambulatory Visit (INDEPENDENT_AMBULATORY_CARE_PROVIDER_SITE_OTHER): Payer: 59 | Admitting: Urology

## 2020-09-01 VITALS — BP 126/76 | HR 61

## 2020-09-01 DIAGNOSIS — N138 Other obstructive and reflux uropathy: Secondary | ICD-10-CM

## 2020-09-01 DIAGNOSIS — R3912 Poor urinary stream: Secondary | ICD-10-CM | POA: Diagnosis not present

## 2020-09-01 DIAGNOSIS — Z8551 Personal history of malignant neoplasm of bladder: Secondary | ICD-10-CM | POA: Diagnosis not present

## 2020-09-01 DIAGNOSIS — N401 Enlarged prostate with lower urinary tract symptoms: Secondary | ICD-10-CM

## 2020-09-01 DIAGNOSIS — N5201 Erectile dysfunction due to arterial insufficiency: Secondary | ICD-10-CM

## 2020-09-01 LAB — URINALYSIS, ROUTINE W REFLEX MICROSCOPIC
Bilirubin, UA: NEGATIVE
Glucose, UA: NEGATIVE
Leukocytes,UA: NEGATIVE
Nitrite, UA: NEGATIVE
Protein,UA: NEGATIVE
RBC, UA: NEGATIVE
Specific Gravity, UA: >1.03 — ABNORMAL HIGH (ref 1.005–1.030)
Urobilinogen, Ur: 0.2 mg/dL (ref 0.2–1.0)
pH, UA: 5.5 (ref 5.0–7.5)

## 2020-09-01 MED ORDER — CIPROFLOXACIN HCL 500 MG PO TABS
500.0000 mg | ORAL_TABLET | Freq: Once | ORAL | Status: AC
  Administered 2020-09-01: 500 mg via ORAL

## 2020-09-01 NOTE — Progress Notes (Signed)
Urological Symptom Review  Patient is experiencing the following symptoms: Get up at night to urinate Leakage of urine   Review of Systems  Gastrointestinal (upper)  : Negative for upper GI symptoms  Gastrointestinal (lower) : Negative for lower GI symptoms  Constitutional : Negative for symptoms  Skin: Negative for skin symptoms  Eyes: Negative for eye symptoms  Ear/Nose/Throat : Negative for Ear/Nose/Throat symptoms  Hematologic/Lymphatic: Negative for Hematologic/Lymphatic symptoms  Cardiovascular : Negative for cardiovascular symptoms  Respiratory : Negative for respiratory symptoms  Endocrine: Negative for endocrine symptoms  Musculoskeletal: Negative for musculoskeletal symptoms  Neurological: Negative for neurological symptoms  Psychologic: Negative for psychiatric symptoms

## 2020-09-01 NOTE — Progress Notes (Signed)
Subjective:  1. History of bladder cancer   2. BPH with urinary obstruction   3. Weak urinary stream   4. Erectile dysfunction due to arterial insufficiency     Lee Hughes returns today in f/u for his history of bladder cancer and returns for cystoscopy. He has had no hematuria. 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   His IPSS is 7.  He has some urgency with mild UUI.  He has a reduced stream.  He has had no hematuria.  He had BCG maintenance with the last treatment on 10/09/19.  His PSA is down to 3.0 on 12/15/19.  On 03/31/19 it was was 4.1 and it was 4.1 again on 07/01/19.  UA is clear today.   He had a TURBT of a 3cm tumor on 12/18/16 from the left lateral wall. It was multifocal Ta primarily low grade disease with some foci of high risk features. He completed induction BCG on 02/22/17    He has progressive ED but has not tried the sildenafil.      IPSS     Row Name 09/01/20 1500         International Prostate Symptom Score   How often have you had the sensation of not emptying your bladder? Not at All     How often have you had to urinate less than every two hours? Less than half the time     How often have you found you stopped and started again several times when you urinated? Not at All     How often have you found it difficult to postpone urination? Not at All     How often have you had a weak urinary stream? About half the time     How often have you had to strain to start urination? Not at All     How many times did you typically get up at night to urinate? 2 Times     Total IPSS Score 7           Quality of Life due to urinary symptoms   If you were to spend the rest of your life with your urinary condition just the way it is now how would you feel about that? Mixed                ROS:  ROS:  A complete review of systems was performed.  All systems are negative except for pertinent findings as noted.   Review of Systems  All other systems reviewed  and are negative.  Allergies  Allergen Reactions   No Known Allergies    Other     No beef, pork, deer, products secondary from tick bite    Outpatient Encounter Medications as of 09/01/2020  Medication Sig   Cetirizine HCl 10 MG CAPS Take by mouth 2 (two) times daily.    Cholecalciferol (VITAMIN D-3) 125 MCG (5000 UT) TABS Take 2 tablets by mouth daily.    escitalopram (LEXAPRO) 5 MG tablet TAKE 1 TABLET BY MOUTH DAILY.   Multiple Vitamin (MULTIVITAMIN) capsule Take by mouth daily.    niacin (NIASPAN) 1000 MG CR tablet Take 1,500 mg by mouth 2 (two) times daily.    omega-3 acid ethyl esters (LOVAZA) 1 g capsule Take 2 capsules (2 g total) by mouth 2 (two) times daily.   tamsulosin (FLOMAX) 0.4 MG CAPS capsule TAKE 1 CAPSULE BY MOUTH ONCE DAILY   [EXPIRED] ciprofloxacin (CIPRO) tablet 500 mg  No facility-administered encounter medications on file as of 09/01/2020.    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   Arthritis    bilateral fingers   Asthma    as child, no longer problematic   Bladder cancer ([place])    Status post surgery and immunotherapy 2018   Carpal tunnel syndrome on right 12/16/2019   Depression    Fatty liver    Hypercholesteremia    Irritable bowel syndrome    Nephrolithiasis    Prediabetes    Throat swelling    from tick   Tongue swelling    from tick   Urticaria    from tick   Varicella zoster     Past Surgical History:  Procedure Laterality Date   COLONOSCOPY N/A 09/09/2012   Procedure: COLONOSCOPY;  Surgeon: Jamesetta So, MD;  [facility];  Service: Gastroenterology;  Laterality: N/A;   Left femur Left 1988   TRANSURETHRAL RESECTION OF BLADDER TUMOR N/A 12/18/2016   Procedure: CYSTOSCOPY TRANSURETHRAL RESECTION OF BLADDER TUMOR (TURBT);  Surgeon: [name], MD;  Location: [facility];  Service: Urology;  Laterality: N/A;    Social History   Socioeconomic History    Marital status: Married    Spouse name: [name]   Number of children: Not on file   Years of education: Not on file   Highest education level: Associate degree: academic program  Occupational History   Not on file  Tobacco Use   Smoking status: Never   Smokeless tobacco: Never  Vaping Use   Vaping Use: Never used  Substance and Sexual Activity   Alcohol use: Yes    Comment: occas   Drug use: Never   Sexual activity: Not Currently  Other Topics Concern   Not on file  Social History Narrative   Married 19 years. Lives with wife. 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   Social Determinants of Health   Financial Resource Strain: Not on file  Food Insecurity: Not on file  Transportation Needs: Not on file  Physical Activity: Not on file  Stress: Not on file  Social Connections: Not on file  Intimate Partner Violence: Not on file    Family History  Problem Relation Age of Onset   Alcoholism Mother    Cirrhosis Mother    Alcoholism Father    Broken bones Father    Pancreatic cancer Brother    Pancreatic disease Brother    Dementia Paternal Aunt    Dementia Paternal Uncle    Stroke Maternal Grandmother    Cancer Maternal Grandfather    Diabetes Maternal Grandfather    Emphysema Paternal Grandfather    Allergic rhinitis Neg Hx    Angioedema Neg Hx    Asthma Neg Hx    Atopy Neg Hx    Eczema Neg Hx    Immunodeficiency Neg Hx    Urticaria Neg Hx        Objective: Vitals:   09/01/20 1448  BP: 126/76  Pulse: 61     Physical Exam  Lab Results:  Results for orders placed or performed in visit on 09/01/20 (from the past 24 hour(s))  Urinalysis, Routine w reflex microscopic     Status: Abnormal   Collection Time: 09/01/20  2:53 PM  Result Value Ref Range   Specific Gravity, UA >1.030 (H) 1.005 - 1.030   pH, UA 5.5 5.0 - 7.5   Color, UA Yellow Yellow  Appearance Ur Clear Clear   Leukocytes,UA Negative Negative   Protein,UA Negative Negative/Trace   Glucose, UA Negative  Negative   Ketones, UA Trace (A) Negative   RBC, UA Negative Negative   Bilirubin, UA Negative Negative   Urobilinogen, Ur 0.2 0.2 - 1.0 mg/dL   Nitrite, UA Negative Negative   Microscopic Examination Comment    Narrative   Performed at:  [facility] [address] [place], [place]  [id number] Lab Director: [place], Phone:  [phone]    BMET No results for input(s): NA, K, CL, CO2, GLUCOSE, BUN, CREATININE, CALCIUM in the last 72 hours. PSA PSA  Date Value Ref Range Status  03/19/2019 4.1 (H) < OR = 4.0 ng/mL Final    Comment:    The total PSA value from this assay system is  standardized against the WHO standard. The test  result will be approximately 20% lower when compared  to the equimolar-standardized total PSA (Beckman  Coulter). Comparison of serial PSA results should be  interpreted with this fact in mind. . This test was performed using the Siemens  chemiluminescent method. Values obtained from  different assay methods cannot be used interchangeably. PSA levels, regardless of value, should not be interpreted as absolute evidence of the presence or absence of disease.    Prostate Specific Ag, Serum  Date Value Ref Range Status  12/15/2019 3.0 0.0 - 4.0 ng/mL Final    Comment:    Roche ECLIA methodology. According to the American Urological Association, Serum PSA should decrease and remain at undetectable levels after radical prostatectomy. The AUA defines biochemical recurrence as an initial PSA value 0.2 ng/mL or greater followed by a subsequent confirmatory PSA value 0.2 ng/mL or greater. Values obtained with different assay methods or kits cannot be used interchangeably. Results cannot be interpreted as absolute evidence of the presence or absence of malignant disease.     No results found for: TESTOSTERONE  Lab Results  Component Value Date   PSA1 3.0 12/15/2019     Studies/Results: Cystoscopy:   He was prepped with  betadine, draped with sterile towels and the urethra was instilled with 2% lidocaine.  Cipro '500mg'$  po given today.   Urethra: normal.  Prostate: 3cm with bilobar hyperplasia with obstruction.  No middle lobe.  Bladder: Mild trabeculation without tumors or stones.   Ureteral orifices: normal.  Complications: none.       Assessment & Plan: 1. History of bladder cancer.   No recurrence noted on cystoscopy.   I will get him set up for a CT hematuria study to assess the upper tracts.   He will need repeat cystoscopy in a year.   2. BPH with BOO and mild LUTS but with occasional urgency with minimal UUI.   He will continue the tamsulosin which was refilled on 08/24/20 for the year.   His PSA was back down to 3.0 and will be repeated in December. .     3. ED.  He has not tried the PDE5's.   Meds ordered this encounter  Medications   ciprofloxacin (CIPRO) tablet 500 mg     Orders Placed This Encounter  Procedures   CT HEMATURIA WORKUP    Standing Status:   Future    Standing Expiration Date:   09/01/2021    Order Specific Question:   Reason for Exam (SYMPTOM  OR DIAGNOSIS REQUIRED)    Answer:   History of bladder cancer.    Order Specific Question:  Preferred imaging location?    Answer:   [facility]    Order Specific Question:   Radiology Contrast Protocol - do NOT remove file path    Answer:   [email or web address]   Urinalysis, Routine w reflex microscopic   PSA, total and free    Standing Status:   Future    Standing Expiration Date:   [id number]   Basic metabolic panel    Standing Status:   Future    Standing Expiration Date:   12/01/2020   PSA, total and free    Standing Status:   Future    Standing Expiration Date:   09/01/2021      Return in about 1 year (around 09/01/2021) for CT and  PSA/BMP in a month but f/u in a year for cystoscopy with a PSA. .   CC:    [name] 09/01/2020 Patient ID: Lodema Hong, male   DOB:  April 14, 1960, 60 y.o.   MRN: [id number]

## 2020-09-27 ENCOUNTER — Other Ambulatory Visit (HOSPITAL_COMMUNITY): Payer: Self-pay

## 2020-09-29 ENCOUNTER — Other Ambulatory Visit: Payer: 59

## 2020-09-29 ENCOUNTER — Other Ambulatory Visit: Payer: Self-pay

## 2020-09-29 DIAGNOSIS — Z8551 Personal history of malignant neoplasm of bladder: Secondary | ICD-10-CM

## 2020-09-29 DIAGNOSIS — N138 Other obstructive and reflux uropathy: Secondary | ICD-10-CM

## 2020-09-29 DIAGNOSIS — N401 Enlarged prostate with lower urinary tract symptoms: Secondary | ICD-10-CM | POA: Diagnosis not present

## 2020-09-30 LAB — PSA, TOTAL AND FREE
PSA, Free Pct: 22.9 %
PSA, Free: 0.48 ng/mL
Prostate Specific Ag, Serum: 2.1 ng/mL (ref 0.0–4.0)

## 2020-09-30 LAB — BASIC METABOLIC PANEL
BUN/Creatinine Ratio: 15 (ref 9–20)
BUN: 15 mg/dL (ref 6–24)
CO2: 25 mmol/L (ref 20–29)
Calcium: 9.5 mg/dL (ref 8.7–10.2)
Chloride: 103 mmol/L (ref 96–106)
Creatinine, Ser: 0.97 mg/dL (ref 0.76–1.27)
Glucose: 88 mg/dL (ref 70–99)
Potassium: 4.8 mmol/L (ref 3.5–5.2)
Sodium: 141 mmol/L (ref 134–144)
eGFR: 90 mL/min/{1.73_m2} (ref >59)

## 2020-10-02 ENCOUNTER — Emergency Department (HOSPITAL_COMMUNITY): Payer: 59

## 2020-10-02 ENCOUNTER — Emergency Department (HOSPITAL_COMMUNITY)
Admission: EM | Admit: 2020-10-02 | Discharge: 2020-10-02 | Disposition: A | Discharge status: Home / Self Care | Payer: 59 | Attending: Emergency Medicine | Admitting: Emergency Medicine

## 2020-10-02 ENCOUNTER — Other Ambulatory Visit: Payer: Self-pay

## 2020-10-02 DIAGNOSIS — Z8551 Personal history of malignant neoplasm of bladder: Secondary | ICD-10-CM | POA: Insufficient documentation

## 2020-10-02 DIAGNOSIS — S61213A Laceration without foreign body of left middle finger without damage to nail, initial encounter: Secondary | ICD-10-CM | POA: Insufficient documentation

## 2020-10-02 DIAGNOSIS — S61412A Laceration without foreign body of left hand, initial encounter: Secondary | ICD-10-CM | POA: Diagnosis not present

## 2020-10-02 DIAGNOSIS — S61211A Laceration without foreign body of left index finger without damage to nail, initial encounter: Secondary | ICD-10-CM | POA: Insufficient documentation

## 2020-10-02 DIAGNOSIS — W293XXA Contact with powered garden and outdoor hand tools and machinery, initial encounter: Secondary | ICD-10-CM | POA: Diagnosis not present

## 2020-10-02 DIAGNOSIS — S6992XA Unspecified injury of left wrist, hand and finger(s), initial encounter: Secondary | ICD-10-CM | POA: Diagnosis present

## 2020-10-02 DIAGNOSIS — J45909 Unspecified asthma, uncomplicated: Secondary | ICD-10-CM | POA: Diagnosis not present

## 2020-10-02 DIAGNOSIS — S61215A Laceration without foreign body of left ring finger without damage to nail, initial encounter: Secondary | ICD-10-CM | POA: Insufficient documentation

## 2020-10-02 MED ORDER — CEPHALEXIN 500 MG PO CAPS: 500.0000 mg | ORAL_CAPSULE | Freq: Four times a day (QID) | ORAL | 0 refills | Status: DC

## 2020-10-02 MED ORDER — POVIDONE-IODINE 5 % EX SOLN
Freq: Once | CUTANEOUS | Status: DC
  Filled 2020-10-02: qty 88.7

## 2020-10-02 MED ORDER — POVIDONE-IODINE 10 % OINT PACKET: TOPICAL_OINTMENT | Freq: Once | CUTANEOUS | Status: AC

## 2020-10-02 MED ORDER — CEPHALEXIN 500 MG PO CAPS
500.0000 mg | ORAL_CAPSULE | Freq: Once | ORAL | Status: AC
  Administered 2020-10-02: 500 mg via ORAL
  Filled 2020-10-02: qty 1

## 2020-10-02 MED ORDER — LIDOCAINE HCL (PF) 1 % IJ SOLN
10.0000 mL | Freq: Once | INTRAMUSCULAR | Status: DC
  Filled 2020-10-02: qty 30

## 2020-10-02 NOTE — Discharge Instructions (Addendum)
Clean the wounds gently with mild soap and water and keep bandaged.  Avoid excessive use of your left hand.  Sutures out in 10 days.  Return here for any signs of infection.

## 2020-10-02 NOTE — ED Provider Notes (Signed)
[facility] EMERGENCY DEPARTMENT Provider Note   CSN: [id number] Arrival date & time: 10/02/20  1304     History Chief Complaint  Patient presents with   Finger Injury    Left 4 finger laceration from hedge trimmer this am    Lee Hughes is a 60 y.o. male.  HPI      Lee Hughes is a 60 y.o. male who presents to the Emergency Department complaining of lacerations to the palmar side of the left index, middle and ring fingers.  He attempted to pick up a battery powered hedge trimmer when the trimmer fell engaging the blades as he attempted to catch it.  He complains of pins and needles sensation to the index finger only.  He controlled bleeding using direct pressure.  He was wearing gloves when the injury occurred.  He denies swelling, nail injury, and difficulty moving his fingers.  TD is up to date.  He does not take anti coagulants.     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 12/16/2019   Depression    Fatty liver    Hypercholesteremia    Irritable bowel syndrome    Nephrolithiasis    Prediabetes    Throat swelling    from tick   Tongue swelling    from tick   Urticaria    from tick   Varicella zoster     Patient Active Problem List   Diagnosis Date Noted   Vitamin D deficiency disease 11/03/2019   BPH with urinary obstruction 02/13/2019   History of bladder cancer 02/13/2019   Erectile dysfunction due to arterial insufficiency 02/13/2019   Cough 02/04/2019   Needs flu shot 10/27/2018   Depression    Hypercholesteremia    Prediabetes    Familial hypercholesterolemia 01/31/2015    Past Surgical History:  Procedure Laterality Date   COLONOSCOPY N/A 09/09/2012   Procedure: COLONOSCOPY;  Surgeon: Jamesetta So, MD;  Location: [facility]  END[facility];  Service: Gastroenterology;  Laterality: N/A;   Left femur Left 1988   TRANSURETHRAL RESECTION OF BLADDER TUMOR N/A 12/18/2016   Procedure: CYSTOSCOPY TRANSURETHRAL RESECTION OF BLADDER TUMOR (TURBT);  Surgeon: [name], MD;  Location: [facility];  Service: Urology;  Laterality: N/A;       Family History  Problem Relation Age of Onset   Alcoholism Mother    Cirrhosis Mother    Alcoholism Father    Broken bones Father    Pancreatic cancer Brother    Pancreatic disease Brother    Dementia Paternal Aunt    Dementia Paternal Uncle    Stroke Maternal Grandmother    Cancer Maternal Grandfather    Diabetes Maternal Grandfather    Emphysema Paternal Grandfather    Allergic rhinitis Neg Hx    Angioedema Neg Hx    Asthma Neg Hx    Atopy Neg Hx    Eczema Neg Hx    Immunodeficiency Neg Hx    Urticaria Neg Hx     Social History   Tobacco Use   Smoking status: Never   Smokeless tobacco: Never  Vaping Use   Vaping Use: Never used  Substance Use Topics   Alcohol use: Yes  Comment: occas   Drug use: Never    Home Medications Prior to Admission medications   Medication Sig Start Date End Date Taking? Authorizing Provider  Cetirizine HCl 10 MG CAPS Take by mouth 2 (two) times daily.    Yes [provider]  Cholecalciferol (VITAMIN D-3) 125 MCG (5000 UT) TABS Take 2 tablets by mouth daily.    Yes [provider]  escitalopram (LEXAPRO) 5 MG tablet TAKE 1 TABLET BY MOUTH DAILY. 08/22/20 08/22/21 Yes Ailene Ards, NP  niacin (NIASPAN) 1000 MG CR tablet Take 1,500 mg by mouth 2 (two) times daily.    Yes [provider]  omega-3 acid ethyl esters (LOVAZA) 1 g capsule Take 2 capsules (2 g total) by mouth 2 (two) times daily. 08/20/20  Yes Ailene Ards, NP  tamsulosin (FLOMAX) 0.4 MG CAPS capsule TAKE 1 CAPSULE BY MOUTH ONCE DAILY 08/24/20 08/24/21 Yes [name], MD  Multiple Vitamin (MULTIVITAMIN) capsule Take by mouth daily.   Patient not taking: Reported on 10/02/2020    [provider]    Allergies    No known allergies and Other  Review of Systems   Review of Systems  Constitutional:  Negative for chills and fever.  Musculoskeletal:  Negative for arthralgias and joint swelling.  Skin:  Positive for wound.       Laceration left index, middle and ring fingers  Neurological:  Negative for dizziness and weakness.       Pin and needles sensation to left index finger  Hematological:  Does not bruise/bleed easily.   Physical Exam Updated Vital Signs BP (!) 138/93 (BP Location: Right Arm)   Pulse 76   Temp 98.5 F (36.9 C) (Oral)   Resp 20   Ht 5\' 11"  (1.803 m)   Wt 89.8 kg   SpO2 98%   BMI 27.62 kg/m   Physical Exam Vitals and nursing note reviewed.  Constitutional:      General: He is not in acute distress.    Appearance: Normal appearance.  HENT:     Head: Atraumatic.  Cardiovascular:     Rate and Rhythm: Normal rate and regular rhythm.  Pulmonary:     Effort: Pulmonary effort is normal.  Musculoskeletal:        General: Normal range of motion.     Left hand: Laceration and tenderness present. No swelling or bony tenderness. Normal range of motion. Normal strength. Normal sensation. Normal capillary refill. Normal pulse.     Comments: 2.5 cm lacerations to palmar aspect of distal left index, middle and 1 cm laceration ring fingers.  Bleeding controlled prior to my exam.  Wounds explored, no foreign bodies.  Has FROM of the DIP and PIP joints of the affected fingers.    Skin:    General: Skin is warm.     Capillary Refill: Capillary refill takes less than 2 seconds.  Neurological:     Mental Status: He is alert.     Motor: Motor function is intact.     Coordination: Coordination is intact.    ED Results / Procedures / Treatments   Labs (all labs ordered are listed, but only abnormal results are displayed) Labs Reviewed - No data to display  EKG None  Radiology DG Hand 2  View Left  Result Date: 10/02/2020 CLINICAL DATA:  Patient with injury to the and with hedge trimmer. EXAM: LEFT HAND - 2 VIEW COMPARISON:  None. FINDINGS: Soft tissue laceration along the palmar aspect of the index  and middle fingers. Additionally there is soft tissue injury along the palmar aspect of the ring finger. No radiopaque foreign body. No acute osseous abnormality. IMPRESSION: Soft tissue laceration along the palmar aspect of the second, third and fourth digits. No acute osseous abnormality. Electronically Signed   By: Lovey Newcomer M.D.   On: 10/02/2020 14:22    Procedures Procedures    LACERATION REPAIR #1 Performed by: Tameka Hoiland Authorized by: Ailyne Pawley Consent: Verbal consent obtained. Risks and benefits: risks, benefits and alternatives were discussed Consent given by: patient Patient identity confirmed: provided demographic data Prepped and Draped in normal sterile fashion Wound explored  Laceration Location: left index finger  Laceration Length: 2.5 cm  No Foreign Bodies seen or palpated  Anesthesia: local infiltration  Local anesthetic: lidocaine 1% w/o epinephrine  Anesthetic total: 2 ml  Irrigation method: syringe Amount of cleaning: standard  Skin closure: 4-0 prolene  Number of sutures: 5  Technique: simple interrupted  Patient tolerance: Patient tolerated the procedure well with no immediate complications.  LACERATION REPAIR #2 Performed by: Jorrell Kuster Authorized by: Adaisha Campise Consent: Verbal consent obtained. Risks and benefits: risks, benefits and alternatives were discussed Consent given by: patient Patient identity confirmed: provided demographic data Prepped and Draped in normal sterile fashion Wound explored  Laceration Location: left long finger  Laceration Length: 2.5 cm  No Foreign Bodies seen or palpated  Anesthesia: local infiltration  Local anesthetic: lidocaine 1 % w/o epinephrine  Anesthetic total: 2  ml  Irrigation method: syringe Amount of cleaning: standard  Skin closure: 4-0 prolene  Number of sutures: 5  Technique: simple interrupted  Patient tolerance: Patient tolerated the procedure well with no immediate complications.  LACERATION REPAIR #3 Performed by: Sascha Palma Authorized by: Ayslin Kundert Consent: Verbal consent obtained. Risks and benefits: risks, benefits and alternatives were discussed Consent given by: patient Patient identity confirmed: provided demographic data Prepped and Draped in normal sterile fashion Wound explored  Laceration Location: left ring finger  Laceration Length: 1 cm  No Foreign Bodies seen or palpated  Anesthesia: local infiltration  Local anesthetic: lidocaine 1%  w/o epinephrine  Anesthetic total: 1 ml  Irrigation method: syringe Amount of cleaning: standard  Skin closure: 4-0 prolene  Number of sutures: 1  Technique: simple interrupted  Patient tolerance: Patient tolerated the procedure well with no immediate complications.    Medications Ordered in ED Medications  lidocaine (PF) (XYLOCAINE) 1 % injection 10 mL (has no administration in time range)  povidone-Iodine (BETADINE) 5 % topical solution (has no administration in time range)  cephALEXin (KEFLEX) capsule 500 mg (has no administration in time range)  povidone-iodine (BETADINE) 10 % ointment ( Topical Given 10/02/20 1529)    ED Course  I have reviewed the triage vital signs and the nursing notes.  Pertinent labs & imaging results that were available during my care of the patient were reviewed by me and considered in my medical decision making (see chart for details).    MDM Rules/Calculators/A&P                           Pt here with lacerations to multiple fingers of the left hand.  Bleeding controlled prior to wound closure.  All wounds thoroughly explored through ROM.  No foreign bodies seen.  Gross sensation to the fingers intact.  Cap refill good  and no nail injury present.    Wounds well approximated, his Td is up to date.  I will start him on Keflex.  Wound care instructions discussed, sutures out in 8-10 days.  Return precautions also discussed.  He's agreeable to out patient f/u with Dr. Aline Brochure.    Final Clinical Impression(s) / ED Diagnoses Final diagnoses:  Laceration of left index finger without foreign body without damage to nail, initial encounter  Laceration of left middle finger without foreign body without damage to nail, initial encounter  Laceration of left ring finger without foreign body without damage to nail, initial encounter    Rx / DC Orders ED Discharge Orders     None        Bufford Lope 10/04/20 2344    Noemi Chapel, MD 10/05/20 1145

## 2020-10-02 NOTE — ED Notes (Signed)
Portable xray obtained

## 2020-10-03 NOTE — Progress Notes (Signed)
Results sent via my chart 

## 2020-10-04 ENCOUNTER — Telehealth: Payer: Self-pay

## 2020-10-04 ENCOUNTER — Ambulatory Visit (HOSPITAL_COMMUNITY): Payer: 59

## 2020-10-04 NOTE — Telephone Encounter (Signed)
Patient received appointment reminder for his CT. Patient states he was not aware of having a CT. Message sent to MD

## 2020-10-05 NOTE — Telephone Encounter (Signed)
Patient wife notified of CT needed. Wife will call scheduling back to reschedule.

## 2020-10-06 ENCOUNTER — Telehealth: Payer: Self-pay

## 2020-10-06 NOTE — Telephone Encounter (Signed)
Patient's wife [name] called wanting to setup an appointment with Dr. Aline Brochure for him. He went to ER on Friday 10/02/20 for lacerations. He has several stitches between his fingers and they were told to see Dr. Aline Brochure if he continues having numbness. States he is still having numbness. I told her that I would speak with Dr. Aline Brochure to see if he needs to be seen here or by a hand specialist and get back with her. I gave Dr. Aline Brochure a copy of ER notes for him to review and let me know what we need to do to help this patient. She would like for him to see Dr. Amedeo Plenty if he needs to see a hand specialist.  Her cell # is [phone].

## 2020-10-07 NOTE — Telephone Encounter (Signed)
As of 10/06/20, per Dr Aline Brochure, schedule Monday, 10/10/20; patient and wife aware of appointment.

## 2020-10-10 ENCOUNTER — Ambulatory Visit: Payer: 59 | Admitting: Orthopedic Surgery

## 2020-10-10 ENCOUNTER — Encounter: Payer: Self-pay | Admitting: Orthopedic Surgery

## 2020-10-10 ENCOUNTER — Other Ambulatory Visit: Payer: Self-pay

## 2020-10-10 VITALS — BP 143/70 | HR 53 | Ht 71.0 in | Wt 198.0 lb

## 2020-10-10 DIAGNOSIS — S61412A Laceration without foreign body of left hand, initial encounter: Secondary | ICD-10-CM

## 2020-10-10 NOTE — Progress Notes (Signed)
Chief Complaint  Patient presents with   laceration index long ring finger LEFT    DOI 10/02/20   60 year old male presents with lacerations of the index long and ring finger of the left hand sustained when he picked up an electric hedge tremor and it turned on and cut his fingers.  He was seen in urgent care appropriate irrigation wound closure was performed 8 days ago  He does complain of numbness on the radial border of his index finger distal to the laceration  He does not smoke is not diabetic does not have hypertension.  He is not on any anticoagulation  Review of systems is negative  His past medical surgical social and family history were reviewed no contributing factors  Medications include Keflex Flomax Lovaza Niaspan Lexapro vitamins vitamin D3 and cetirizine  Examination of the left long and ring finger show a lacerations primarily distal to the proximal flexion crease FDS and FDP tendons are normal in all 3 digits sensation color and capillary refill are all normal except for the radial border of the index finger   Encounter Diagnosis  Name Primary?   Laceration of left hand without foreign body, initial encounter index long and ring fingers Yes    Plan is to remove the sutures in 2 days  I discussed the treatment for the digital nerve injury and recommended nonoperative treatment with expectation of some residual sensory loss which may be permanent  He agrees that is not worth having microsurgery to repair  Follow-up in 2 days to remove the sutures

## 2020-10-12 ENCOUNTER — Encounter: Payer: Self-pay | Admitting: Orthopedic Surgery

## 2020-10-12 ENCOUNTER — Other Ambulatory Visit: Payer: Self-pay

## 2020-10-12 ENCOUNTER — Ambulatory Visit (INDEPENDENT_AMBULATORY_CARE_PROVIDER_SITE_OTHER): Payer: 59 | Admitting: Orthopedic Surgery

## 2020-10-12 VITALS — BP 138/69 | HR 54 | Ht 71.0 in | Wt 198.0 lb

## 2020-10-12 DIAGNOSIS — S61412A Laceration without foreign body of left hand, initial encounter: Secondary | ICD-10-CM

## 2020-10-12 NOTE — Progress Notes (Signed)
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

## 2020-10-13 ENCOUNTER — Ambulatory Visit (INDEPENDENT_AMBULATORY_CARE_PROVIDER_SITE_OTHER): Payer: 59 | Admitting: Internal Medicine

## 2020-10-24 ENCOUNTER — Encounter: Payer: Self-pay | Admitting: Internal Medicine

## 2020-10-24 ENCOUNTER — Other Ambulatory Visit: Payer: Self-pay

## 2020-10-24 ENCOUNTER — Ambulatory Visit: Payer: 59 | Admitting: Internal Medicine

## 2020-10-24 VITALS — BP 116/72 | HR 57 | Temp 98.3°F | Ht 71.0 in | Wt 204.0 lb

## 2020-10-24 DIAGNOSIS — F411 Generalized anxiety disorder: Secondary | ICD-10-CM | POA: Insufficient documentation

## 2020-10-24 DIAGNOSIS — E559 Vitamin D deficiency, unspecified: Secondary | ICD-10-CM | POA: Diagnosis not present

## 2020-10-24 DIAGNOSIS — E7800 Pure hypercholesterolemia, unspecified: Secondary | ICD-10-CM

## 2020-10-24 DIAGNOSIS — Z7689 Persons encountering health services in other specified circumstances: Secondary | ICD-10-CM | POA: Diagnosis not present

## 2020-10-24 DIAGNOSIS — Z8551 Personal history of malignant neoplasm of bladder: Secondary | ICD-10-CM | POA: Diagnosis not present

## 2020-10-24 DIAGNOSIS — N401 Enlarged prostate with lower urinary tract symptoms: Secondary | ICD-10-CM

## 2020-10-24 DIAGNOSIS — Z87898 Personal history of other specified conditions: Secondary | ICD-10-CM | POA: Diagnosis not present

## 2020-10-24 DIAGNOSIS — N138 Other obstructive and reflux uropathy: Secondary | ICD-10-CM | POA: Diagnosis not present

## 2020-10-24 DIAGNOSIS — Z23 Encounter for immunization: Secondary | ICD-10-CM | POA: Diagnosis not present

## 2020-10-24 MED ORDER — ROSUVASTATIN CALCIUM 10 MG PO TABS: 10.0000 mg | ORAL_TABLET | Freq: Every day | ORAL | 1 refills | Status: DC

## 2020-10-24 NOTE — Assessment & Plan Note (Signed)
Lipid profile reviewed DC Niacin, started Crestor Continue Lovaza Check lipid profile before the next visit

## 2020-10-24 NOTE — Assessment & Plan Note (Signed)
Care established History and medications reviewed with the patient 

## 2020-10-24 NOTE — Assessment & Plan Note (Signed)
Well-controlled with Lexapro

## 2020-10-24 NOTE — Patient Instructions (Addendum)
Please start taking Crestor as prescribed.  Please stop taking Niacin.  Please continue taking other medications as prescribed.  Continue to follow low salt and low cholesterol diet and perform moderate exercise/walking at least 150 mins/week.  Please get fasting blood tests done before the next visit.

## 2020-10-24 NOTE — Assessment & Plan Note (Signed)
Last vitamin D Lab Results  Component Value Date   VD25OH 61 07/12/2020   Takes Vitamin D 10,000 IU QD Advised to take Vitamin D 2000 IU only

## 2020-10-24 NOTE — Assessment & Plan Note (Signed)
S/p TURBT Followed by Urology

## 2020-10-24 NOTE — Assessment & Plan Note (Signed)
On Flomax 

## 2020-10-24 NOTE — Progress Notes (Signed)
New Patient Office Visit  Subjective:  Patient ID: Lee Hughes, male    DOB: 02-Aug-1960  Age: 60 y.o. MRN: [id number]  CC:  Chief Complaint  Patient presents with   New Patient (Initial Visit)    New pt. Previous PCP Dr. Anastasio Champion. Last seen first of September. Discuss high cholesterol. Has seen Metabologist and has been on Repatha (too expensive). Dr. Anastasio Champion had patient doing plant based diet    HPI Lee Hughes is a 60 year old male with PMH of bladder cancer s/p TURBT, BPH, HLD and GAD who presents for establishing care.  He has history of HLD.  Chart review suggests TG more than 800 in the past, and was on Repatha for some time after trying statin.  Does not have a history of HTN, DM or CVA.  His last lipid profile showed elevated LDL more than 190.  He has been following low cholesterol diet and taking Lovaza and niacin.  He recently had laceration injury of left hand at home from battery-operated machine, for which he has been following up with Dr. Aline Brochure and it has been healing well.  He has mild numbness over the fingertips, but denies any focal weakness.  He follows up with urology for history of bladder cancer s/p TURBT.  He takes Flomax for BPH.  Denies any dysuria or hematuria currently.  Denies urinary hesitancy or resistance.  He takes Lexapro for history of GAD.  Denies any anhedonia, SI or HI.  He has had 2 doses of COVID-vaccine.  He received flu vaccine in the office today.  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   Arthritis    bilateral fingers   Asthma    as child, no longer problematic   Bladder cancer ([place])    Status post surgery and immunotherapy 2018   Carpal tunnel syndrome on right 12/16/2019   Depression    Fatty liver    Hypercholesteremia    Irritable bowel syndrome    Nephrolithiasis    Prediabetes    Throat swelling    from tick   Tongue swelling    from tick   Urticaria    from tick    Varicella zoster     Past Surgical History:  Procedure Laterality Date   COLONOSCOPY N/A 09/09/2012   Procedure: COLONOSCOPY;  Surgeon: Jamesetta So, MD;  [facility];  Service: Gastroenterology;  Laterality: N/A;   Left femur Left 1988   TRANSURETHRAL RESECTION OF BLADDER TUMOR N/A 12/18/2016   Procedure: CYSTOSCOPY TRANSURETHRAL RESECTION OF BLADDER TUMOR (TURBT);  Surgeon: [name], MD;  Location: [facility];  Service: Urology;  Laterality: N/A;    Family History  Problem Relation Age of Onset   Alcoholism Mother    Cirrhosis Mother    Alcoholism Father    Broken bones Father    Pancreatic cancer Brother    Pancreatic disease Brother    Dementia Paternal Aunt    Dementia Paternal Uncle    Stroke Maternal Grandmother    Cancer Maternal Grandfather    Diabetes Maternal Grandfather    Emphysema Paternal Grandfather    Allergic rhinitis Neg Hx    Angioedema Neg Hx    Asthma Neg Hx    Atopy Neg Hx    Eczema Neg Hx    Immunodeficiency Neg Hx    Urticaria Neg Hx     Social History   Socioeconomic History  Marital status: Married    Spouse name: [name]   Number of children: Not on file   Years of education: Not on file   Highest education level: Associate degree: academic program  Occupational History   Not on file  Tobacco Use   Smoking status: Never   Smokeless tobacco: Never  Vaping Use   Vaping Use: Never used  Substance and Sexual Activity   Alcohol use: Yes    Comment: occas   Drug use: Never   Sexual activity: Not Currently  Other Topics Concern   Not on file  Social History Narrative   Married 19 years. Lives with wife. 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   Social Determinants of Health   Financial Resource Strain: Not on file  Food Insecurity: Not on file  Transportation Needs: Not on file  Physical Activity: Not on file  Stress: Not on file  Social Connections: Not on file  Intimate Partner Violence: Not on file     ROS Review of Systems  Constitutional:  Negative for chills and fever.  HENT:  Negative for congestion and sore throat.   Eyes:  Negative for pain and discharge.  Respiratory:  Negative for cough and shortness of breath.   Cardiovascular:  Negative for chest pain and palpitations.  Gastrointestinal:  Negative for constipation, diarrhea, nausea and vomiting.  Endocrine: Negative for polydipsia and polyuria.  Genitourinary:  Negative for dysuria and hematuria.  Musculoskeletal:  Negative for neck pain and neck stiffness.  Skin:  Positive for wound. Negative for rash.  Neurological:  Negative for dizziness, weakness, numbness and headaches.  Psychiatric/Behavioral:  Negative for agitation and behavioral problems.    Objective:   Today's Vitals: BP 116/72 (BP Location: Left Arm, Patient Position: Sitting, Cuff Size: Normal)   Pulse (!) 57   Temp 98.3 F (36.8 C) (Oral)   Ht _0  (1.803 m)   Wt 204 lb (92.5 kg)   SpO2 96%   BMI 28.45 kg/m   Physical Exam Vitals reviewed.  Constitutional:      General: He is not in acute distress.    Appearance: He is not diaphoretic.  HENT:     Head: Normocephalic and atraumatic.     Nose: Nose normal.     Mouth/Throat:     Mouth: Mucous membranes are moist.  Eyes:     General: No scleral icterus.    Extraocular Movements: Extraocular movements intact.  Cardiovascular:     Rate and Rhythm: Normal rate and regular rhythm.     Pulses: Normal pulses.     Heart sounds: Normal heart sounds. No murmur heard. Pulmonary:     Breath sounds: Normal breath sounds. No wheezing or rales.  Musculoskeletal:     Cervical back: Neck supple. No tenderness.     Right lower leg: No edema.     Left lower leg: No edema.  Skin:    General: Skin is warm.     Findings: Bruising (laceratin injury over left hand, healing) present. No rash.  Neurological:     General: No focal deficit present.     Mental Status: He is alert and oriented to person,  place, and time.     Sensory: No sensory deficit.     Motor: No weakness.  Psychiatric:        Mood and Affect: Mood normal.        Behavior: Behavior normal.    Assessment & Plan:   Problem List Items Addressed This Visit  Genitourinary   BPH with urinary obstruction    On Flomax        Other   Hypercholesteremia    Lipid profile reviewed DC Niacin, started Crestor Continue Lovaza Check lipid profile before the next visit      Relevant Medications   rosuvastatin (CRESTOR) 10 MG tablet   Other Relevant Orders   CMP14+EGFR   Lipid panel   History of bladder cancer    S/p TURBT Followed by Urology      Vitamin D deficiency    Last vitamin D Lab Results  Component Value Date   VD25OH 37 07/12/2020  Takes Vitamin D 10,000 IU QD Advised to take Vitamin D 2000 IU only      GAD (generalized anxiety disorder)    Well-controlled with Lexapro      Encounter to establish care - Primary    Care established History and medications reviewed with the patient      Other Visit Diagnoses     Need for immunization against influenza       Relevant Orders   Flu Vaccine QUAD 93moIM (Fluarix, Fluzone & Alfiuria Quad PF) (Completed)   History of prediabetes       Relevant Orders   HgB A1c       Outpatient Encounter Medications as of 10/24/2020  Medication Sig   Cetirizine HCl 10 MG CAPS Take by mouth 2 (two) times daily.    Cholecalciferol (VITAMIN D-3) 125 MCG (5000 UT) TABS Take 2 tablets by mouth daily.    escitalopram (LEXAPRO) 5 MG tablet TAKE 1 TABLET BY MOUTH DAILY.   Multiple Vitamin (MULTIVITAMIN) capsule Take by mouth daily.   omega-3 acid ethyl esters (LOVAZA) 1 g capsule Take 2 capsules (2 g total) by mouth 2 (two) times daily.   rosuvastatin (CRESTOR) 10 MG tablet Take 1 tablet (10 mg total) by mouth daily.   tamsulosin (FLOMAX) 0.4 MG CAPS capsule TAKE 1 CAPSULE BY MOUTH ONCE DAILY   [DISCONTINUED] niacin (NIASPAN) 1000 MG CR tablet Take 1,500  mg by mouth 2 (two) times daily.    [DISCONTINUED] cephALEXin (KEFLEX) 500 MG capsule Take 1 capsule (500 mg total) by mouth 4 (four) times daily.   No facility-administered encounter medications on file as of 10/24/2020.    Follow-up: Return in about 6 months (around 04/24/2021).   RLindell Spar MD

## 2020-10-28 ENCOUNTER — Other Ambulatory Visit (HOSPITAL_COMMUNITY): Payer: Self-pay

## 2020-11-04 ENCOUNTER — Other Ambulatory Visit (HOSPITAL_COMMUNITY): Payer: Self-pay

## 2020-11-08 ENCOUNTER — Other Ambulatory Visit: Payer: Self-pay | Admitting: Internal Medicine

## 2020-11-08 ENCOUNTER — Other Ambulatory Visit (HOSPITAL_COMMUNITY): Payer: Self-pay

## 2020-11-08 ENCOUNTER — Telehealth: Payer: Self-pay

## 2020-11-08 DIAGNOSIS — F3341 Major depressive disorder, recurrent, in partial remission: Secondary | ICD-10-CM

## 2020-11-08 MED ORDER — ESCITALOPRAM OXALATE 5 MG PO TABS
ORAL_TABLET | Freq: Every day | ORAL | 1 refills | Status: AC
  Filled 2020-11-08: qty 90, 90d supply, fill #0

## 2020-11-08 NOTE — Telephone Encounter (Signed)
Patient spouse called need med refill on Lexapro 5 mg, patient asked to continue on this medicine.  Send in to [facility]

## 2020-12-01 ENCOUNTER — Other Ambulatory Visit (HOSPITAL_COMMUNITY): Payer: Self-pay

## 2021-02-07 ENCOUNTER — Other Ambulatory Visit (HOSPITAL_COMMUNITY): Payer: Self-pay

## 2021-03-02 ENCOUNTER — Ambulatory Visit (INDEPENDENT_AMBULATORY_CARE_PROVIDER_SITE_OTHER): Payer: Commercial Managed Care - PPO | Admitting: General Surgery

## 2021-03-02 ENCOUNTER — Other Ambulatory Visit: Payer: Self-pay

## 2021-03-02 ENCOUNTER — Encounter: Payer: Self-pay | Admitting: General Surgery

## 2021-03-02 VITALS — BP 116/65 | HR 55 | Temp 98.1°F | Resp 12 | Ht 71.0 in | Wt 199.0 lb

## 2021-03-02 DIAGNOSIS — C679 Malignant neoplasm of bladder, unspecified: Secondary | ICD-10-CM | POA: Diagnosis not present

## 2021-03-02 DIAGNOSIS — D485 Neoplasm of uncertain behavior of skin: Secondary | ICD-10-CM

## 2021-03-02 DIAGNOSIS — R238 Other skin changes: Secondary | ICD-10-CM | POA: Diagnosis not present

## 2021-03-02 DIAGNOSIS — D489 Neoplasm of uncertain behavior, unspecified: Secondary | ICD-10-CM | POA: Insufficient documentation

## 2021-03-02 NOTE — Patient Instructions (Addendum)
Tylenol 1000mg  @ 6am, 12noon, 6pm, 45midnight (Do not exceed 4000mg  of tylenol a day). Ibuprofen 800mg  @ 9am, 3pm, 9pm, 3am (Do not exceed 3600mg  of ibuprofen a day).  ? ?Will let Kelli know the results.  ?Sutures out 3/12-3/16.  ? ? ?

## 2021-03-06 NOTE — Progress Notes (Signed)
[facility] History and Physical ? ?Reason for Referral: Right thigh skin lesion, left posterior calf skin lesion  ?Referring Physician: Self  ? ?Chief Complaint   ?Lesion removal right hip area  ?  ? ? ?Lee Hughes is a 61 y.o. male.  ?HPI: Lee Hughes is a 61 yo who has bladder cancer and has been in the sun most of his life. He has a lesion on his right hip that he feels like has been growing and is starting to look scaly in nature. He denies any color changes but notices the lesion when he is wearing certain closes as it is starting to protrude. He also has multiple other lesions, freckles on his back and legs but this one is larger.  ? ?He has a second lesion on his left posterior calf that has turned white /scaly in the last few months. He has always been exposed to sun and has worn sunscreen more as an adult than a child. He has no personal history of any melanomas. He has not seen a dermatologist. ? ?Past Medical History:  ?Diagnosis Date  ? 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 Arthritis   ? bilateral fingers  ? Asthma   ? as child, no longer problematic  ? Bladder cancer ([place])   ? Status post surgery and immunotherapy 2018  ? Carpal tunnel syndrome on right 12/16/2019  ? Depression   ? Fatty liver   ? Hypercholesteremia   ? Irritable bowel syndrome   ? Nephrolithiasis   ? Prediabetes   ? Throat swelling   ? from tick  ? Tongue swelling   ? from tick  ? Urticaria   ? from tick  ? Varicella zoster   ? ? ?Past Surgical History:  ?Procedure Laterality Date  ? COLONOSCOPY N/A 09/09/2012  ? Procedure: COLONOSCOPY;  Surgeon: Jamesetta So, MD;  [facility];  Service: Gastroenterology;  Laterality: N/A;  ? Left femur Left 1988  ? TRANSURETHRAL RESECTION OF BLADDER TUMOR N/A 12/18/2016  ? Procedure: CYSTOSCOPY TRANSURETHRAL RESECTION OF BLADDER TUMOR (TURBT);  Surgeon: [name], MD;  Location: [facility];  Service: Urology;  Laterality: N/A;  ? ? ?Family  History  ?Problem Relation Age of Onset  ? Alcoholism Mother   ? Cirrhosis Mother   ? Alcoholism Father   ? Broken bones Father   ? Pancreatic cancer Brother   ? Pancreatic disease Brother   ? Dementia Paternal Aunt   ? Dementia Paternal Uncle   ? Stroke Maternal Grandmother   ? Cancer Maternal Grandfather   ? Diabetes Maternal Grandfather   ? Emphysema Paternal Grandfather   ? Allergic rhinitis Neg Hx   ? Angioedema Neg Hx   ? Asthma Neg Hx   ? Atopy Neg Hx   ? Eczema Neg Hx   ? Immunodeficiency Neg Hx   ? Urticaria Neg Hx   ? ? ?Social History  ? ?Tobacco Use  ? Smoking status: Never  ? Smokeless tobacco: Never  ?Vaping Use  ? Vaping Use: Never used  ?Substance Use Topics  ? Alcohol use: Yes  ?  Comment: occas  ? Drug use: Never  ? ? ?Medications: I have reviewed the patient's current medications. ?Allergies as of 03/02/2021   ? ?   Reactions  ? No Known Allergies   ? Other   ? No beef, pork, deer, products secondary from tick bite  ? ?  ? ?  ?  Medication List  ?  ? ?  ? Accurate as of March 02, 2021 11:59 PM. If you have any questions, ask your nurse or doctor.  ?  ?  ? ?  ? ?Cetirizine HCl 10 MG Caps ?Take by mouth 2 (two) times daily. ?  ?escitalopram 5 MG tablet ?Commonly known as: LEXAPRO ?TAKE 1 TABLET BY MOUTH DAILY. ?  ?multivitamin capsule ?Take by mouth daily. ?  ?omega-3 acid ethyl esters 1 g capsule ?Commonly known as: LOVAZA ?Take 2 capsules (2 g total) by mouth 2 (two) times daily. ?  ?rosuvastatin 10 MG tablet ?Commonly known as: Crestor ?Take 1 tablet (10 mg total) by mouth daily. ?  ?tamsulosin 0.4 MG Caps capsule ?Commonly known as: FLOMAX ?TAKE 1 CAPSULE BY MOUTH ONCE DAILY ?  ?Vitamin D-3 125 MCG (5000 UT) Tabs ?Take 2 tablets by mouth daily. ?  ? ?  ? ? ? ?ROS:  ?A comprehensive review of systems was negative except for: Musculoskeletal: positive for joint pain ? ?Blood pressure 116/65, pulse (!) 55, temperature 98.1 ?F (36.7 ?C), temperature source Other (Comment), resp. rate 12, height '5\' 11"'$   (1.803 m), weight 199 lb (90.3 kg), SpO2 96 %. ?Physical Exam ?Vitals reviewed.  ?Constitutional:   ?   Appearance: He is normal weight.  ?HENT:  ?   Head: Normocephalic.  ?   Nose: Nose normal.  ?Eyes:  ?   Extraocular Movements: Extraocular movements intact.  ?Cardiovascular:  ?   Rate and Rhythm: Normal rate and regular rhythm.  ?Pulmonary:  ?   Effort: Pulmonary effort is normal.  ?   Breath sounds: Normal breath sounds.  ?Abdominal:  ?   General: There is no distension.  ?   Palpations: Abdomen is soft.  ?   Tenderness: There is no abdominal tenderness.  ?Musculoskeletal:     ?   General: Normal range of motion.  ?   Cervical back: Normal range of motion.  ?   Comments: Multiple white discolored patches on his legs consistent with tinea vesicolor, pigmented lesions on his legs scattered, small, right upper thigh, hip region, 1.5cm raised, scaly/ waxy grayish brown and a left posterior calf 1cm lesion with whitish scaly, round  ?Skin: ?   General: Skin is warm.  ?Neurological:  ?   General: No focal deficit present.  ?   Mental Status: He is alert and oriented to person, place, and time.  ?Psychiatric:     ?   Mood and Affect: Mood normal.     ?   Behavior: Behavior normal.  ? ? 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 ?Description: The right hip was prepped and draped. Lidocaine was injected. An elliptical incision was made around the lesion and the full thickness of the skin was excised with sharp dissection, measuring 2 cm in size. The deep space was made hemostatic and the area was closed with 3-0 Nylon sutures. Neosporin and a Band-Aid were placed.   ? ?The left posterior calf was then prepped and draped in the normal sterile fashion. Lidocaine was injected. An elliptical incision was made around the lesion and full thickness of the skin was excised with sharp  dissection, measuring 1.5cm.  The deep space was made hemostatic and the area was closed with 3-0 Nylon sutures. Neosporin and a Band-Aid were placed.   ? ? ? ?Assessment &  Plan:  ?Lee Hughes is a 61 y.o. male with what looks like tinea versicolor on his legs with normal age spots/ freckles and likely seborrheic keratosis on the right hip and left posterior calf. These were excised and will be sent to pathology to confirm.  ?- Recommended selsun blue to the legs for the tinea versicolor ?- Will remove the sutures in 10-14 days  ?- Neopsorin and bandaid daily ?- Ok to shower  ? ?All questions were answered to the satisfaction of the patient and family. ? ?Curlene Labrum, MD ?[facility] ?[place], [place] [address] ?[phone] (office) ? ? ?03/02/2021 ? ? ? ?

## 2021-03-07 LAB — TISSUE SPECIMEN

## 2021-03-07 LAB — PATHOLOGY REPORT

## 2021-03-31 ENCOUNTER — Other Ambulatory Visit: Payer: Self-pay | Admitting: Urology

## 2021-03-31 ENCOUNTER — Telehealth: Payer: Self-pay

## 2021-03-31 ENCOUNTER — Ambulatory Visit (HOSPITAL_COMMUNITY)
Admission: RE | Admit: 2021-03-31 | Discharge: 2021-03-31 | Disposition: A | Discharge status: Home / Self Care | Payer: Commercial Managed Care - PPO | Source: Ambulatory Visit | Attending: Urology | Admitting: Urology

## 2021-03-31 DIAGNOSIS — N200 Calculus of kidney: Secondary | ICD-10-CM

## 2021-03-31 DIAGNOSIS — R109 Unspecified abdominal pain: Secondary | ICD-10-CM | POA: Diagnosis present

## 2021-03-31 MED ORDER — ONDANSETRON HCL 4 MG PO TABS: 4.0000 mg | ORAL_TABLET | Freq: Three times a day (TID) | ORAL | 0 refills | Status: DC | PRN

## 2021-03-31 MED ORDER — OXYCODONE-ACETAMINOPHEN 5-325 MG PO TABS: 1.0000 | ORAL_TABLET | ORAL | 0 refills | Status: DC | PRN

## 2021-03-31 NOTE — Telephone Encounter (Signed)
Patient called in this am with new onset flank pain ( patient reports history of kidney stone) and nausea as well. ? ?Reviewed with Dr. Jeffie Pollock. Stat CT ordered.  ?

## 2021-03-31 NOTE — Telephone Encounter (Signed)
Wife called and notified of CT results per Dr. Jeffie Pollock and Dr. Alyson Ingles - Dr. Alyson Ingles to fill pain medication in Dr. Jeffie Pollock absence and KUB ordered for possible litho of Tuesday if symptoms persist. Wife voiced understanding.  ?

## 2021-04-03 ENCOUNTER — Ambulatory Visit: Payer: Commercial Managed Care - PPO | Admitting: Urology

## 2021-04-03 ENCOUNTER — Ambulatory Visit (HOSPITAL_COMMUNITY)
Admission: RE | Admit: 2021-04-03 | Discharge: 2021-04-03 | Disposition: A | Discharge status: Home / Self Care | Payer: Commercial Managed Care - PPO | Source: Ambulatory Visit | Attending: Urology | Admitting: Urology

## 2021-04-03 ENCOUNTER — Ambulatory Visit (INDEPENDENT_AMBULATORY_CARE_PROVIDER_SITE_OTHER): Payer: Commercial Managed Care - PPO | Admitting: Urology

## 2021-04-03 ENCOUNTER — Encounter: Payer: Self-pay | Admitting: Urology

## 2021-04-03 ENCOUNTER — Telehealth: Payer: Self-pay

## 2021-04-03 VITALS — BP 134/80 | HR 68

## 2021-04-03 DIAGNOSIS — N200 Calculus of kidney: Secondary | ICD-10-CM | POA: Insufficient documentation

## 2021-04-03 LAB — URINALYSIS, ROUTINE W REFLEX MICROSCOPIC
Bilirubin, UA: NEGATIVE
Glucose, UA: NEGATIVE
Ketones, UA: NEGATIVE
Leukocytes,UA: NEGATIVE
Nitrite, UA: NEGATIVE
Protein,UA: NEGATIVE
Specific Gravity, UA: 1.02 (ref 1.005–1.030)
Urobilinogen, Ur: 0.2 mg/dL (ref 0.2–1.0)
pH, UA: 5.5 (ref 5.0–7.5)

## 2021-04-03 LAB — MICROSCOPIC EXAMINATION
Bacteria, UA: NONE SEEN
Epithelial Cells (non renal): NONE SEEN /hpf (ref 0–10)
RBC, Urine: >30 /hpf — AB (ref 0–2)
WBC, UA: NONE SEEN /hpf (ref 0–5)

## 2021-04-03 NOTE — Progress Notes (Signed)
? ?04/03/2021 ?3:35 PM  ? ?Lee Hughes ?[birth year] ?[id number] ? ?Referring provider: Lindell Spar, MD ?[address] ?[place],  [place] [address] ? ?Left flank pain ? ? ?HPI: ?Mr Lee Hughes is a 61yo here for evaluation of nephrolithiasis. This is his fourth stone event. Starting Thursday he developed severe, sharp, constant left flank pain. He underwent CT 03/31/2021 and was diagnosed with a 32m left proximal ureteral calculus. His pain continued to worsen and now he has nausea and vomiting. He denies any worsening LUTS. UA today shows >30 RBCs and pH of 5.5. No other associated symptoms. No exacerbating events. Pain alleviated with narcotics. KUB from today shows 54mcalculus over the pelvis.  ? ? ?PMH: ?Past Medical History:  ?Diagnosis Date  ? 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 Arthritis   ? bilateral fingers  ? Asthma   ? as child, no longer problematic  ? [place]  ? Status post surgery and immunotherapy 2018  ? Carpal tunnel syndrome on right 12/16/2019  ? Depression   ? Fatty liver   ? Hypercholesteremia   ? Irritable bowel syndrome   ? Nephrolithiasis   ? Prediabetes   ? Throat swelling   ? from tick  ? Tongue swelling   ? from tick  ? Urticaria   ? from tick  ? Varicella zoster   ? ? ?Surgical History: ?Past Surgical History:  ?Procedure Laterality Date  ? COLONOSCOPY N/A 09/09/2012  ? Procedure: COLONOSCOPY;  Surgeon: Lee Hughes;  [facility];  Service: Gastroenterology;  Laterality: N/A;  ? Left femur Left 1988  ? TRANSURETHRAL RESECTION OF BLADDER TUMOR N/A 12/18/2016  ? Procedure: CYSTOSCOPY TRANSURETHRAL RESECTION OF BLADDER TUMOR (TURBT);  Surgeon: Lee Hughes;  Location: [facility] Service: Urology;  Laterality: N/A;  ? ? ?Home Medications:  ?Allergies as of 04/03/2021   ? ?   Reactions  ? No Known Allergies   ? Other   ? No beef, pork, deer, products secondary from tick bite  ? ?  ? ?  ?Medication List  ?  ? ?  ? Accurate as of April 03, 2021  3:35 PM. If you have any questions, ask your nurse or doctor.  ?  ?  ? ?  ? ?Cetirizine HCl 10 MG Caps ?Take by mouth 2 (two) times daily. ?  ?escitalopram 5 MG tablet ?Commonly known as: LEXAPRO ?TAKE 1 TABLET BY MOUTH DAILY. ?  ?multivitamin capsule ?Take by mouth daily. ?  ?omega-3 acid ethyl esters 1 g capsule ?Commonly known as: LOVAZA ?Take 2 capsules (2 g total) by mouth 2 (two) times daily. ?  ?ondansetron 4 MG tablet ?Commonly known as: Zofran ?Take 1 tablet (4 mg total) by mouth every 8 (eight) hours as needed for nausea or vomiting. ?  ?oxyCODONE-acetaminophen 5-325 MG tablet ?Commonly known as: Percocet ?Take 1 tablet by mouth every 4 (four) hours as needed. ?  ?rosuvastatin 10 MG tablet ?Commonly known as: Crestor ?Take 1 tablet (10 mg total) by mouth daily. ?  ?tamsulosin 0.4 MG Caps capsule ?Commonly known as: FLOMAX ?TAKE 1 CAPSULE BY MOUTH ONCE DAILY ?  ?Vitamin D-3 125 MCG (5000 UT) Tabs ?Take 2 tablets by mouth daily. ?  ? ?  ? ? ?Allergies:  ?Allergies  ?Allergen Reactions  ? No Known Allergies   ? Other   ?  No beef, pork, deer, products secondary from tick bite  ? ? ?  Family History: ?Family History  ?Problem Relation Age of Onset  ? Alcoholism Mother   ? Cirrhosis Mother   ? Alcoholism Father   ? Broken bones Father   ? Pancreatic cancer Brother   ? Pancreatic disease Brother   ? Dementia Paternal Aunt   ? Dementia Paternal Uncle   ? Stroke Maternal Grandmother   ? Cancer Maternal Grandfather   ? Diabetes Maternal Grandfather   ? Emphysema Paternal Grandfather   ? Allergic rhinitis Neg Hx   ? Angioedema Neg Hx   ? Asthma Neg Hx   ? Atopy Neg Hx   ? Eczema Neg Hx   ? Immunodeficiency Neg Hx   ? Urticaria Neg Hx   ? ? ?Social History:  reports that he has never smoked. He has never used smokeless tobacco. He reports current alcohol use. He reports that he does not use drugs. ? ?ROS: ?All other review of systems were reviewed and are negative except what is noted above in HPI ? ?Physical  Exam: ?BP 134/80   Pulse 68   ?Constitutional:  Alert and oriented, No acute distress. ?HEENT: [place] AT, moist mucus membranes.  Trachea midline, no masses. ?Cardiovascular: No clubbing, cyanosis, or edema. ?Respiratory: Normal respiratory effort, no increased work of breathing. ?GI: Abdomen is soft, nontender, nondistended, no abdominal masses ?GU: No CVA tenderness.  ?Lymph: No cervical or inguinal lymphadenopathy. ?Skin: No rashes, bruises or suspicious lesions. ?Neurologic: Grossly intact, no focal deficits, moving all 4 extremities. ?Psychiatric: Normal mood and affect. ? ?Laboratory Data: ?Lab Results  ?Component Value Date  ? WBC 4.2 07/12/2020  ? HGB 15.3 07/12/2020  ? HCT 47.5 07/12/2020  ? MCV 81.5 07/12/2020  ? PLT 207 07/12/2020  ? ? ?Lab Results  ?Component Value Date  ? CREATININE 0.97 09/29/2020  ? ? ?Lab Results  ?Component Value Date  ? PSA 4.1 (H) 03/19/2019  ? ? ?No results found for: TESTOSTERONE ? ?Lab Results  ?Component Value Date  ? HGBA1C 5.4 07/12/2020  ? ? ?Urinalysis ?   ?Component Value Date/Time  ? COLORURINE YELLOW 11/02/2016 1000  ? APPEARANCEUR Clear 09/01/2020 1453  ? LABSPEC 1.018 11/02/2016 1000  ? PHURINE 5.0 11/02/2016 1000  ? GLUCOSEU Negative 09/01/2020 1453  ? HGBUR LARGE (A) 11/02/2016 1000  ? BILIRUBINUR Negative 09/01/2020 1453  ? [place] NEGATIVE 11/02/2016 1000  ? PROTEINUR Negative 09/01/2020 1453  ? PROTEINUR NEGATIVE 11/02/2016 1000  ? UROBILINOGEN 0.2 03/06/2019 1323  ? NITRITE Negative 09/01/2020 1453  ? NITRITE NEGATIVE 11/02/2016 1000  ? LEUKOCYTESUR Negative 09/01/2020 1453  ? ? ?Lab Results  ?Component Value Date  ? LABMICR Comment 09/01/2020  ? WBCUA 0-5 10/09/2019  ? LABEPIT 0-10 10/09/2019  ? BACTERIA None seen 10/09/2019  ? ? ?Pertinent Imaging: ?CT stone study 3/31 and KUB today: Images reviewed and discussed with the patient ?No results found for this or any previous visit. ? ?No results found for this or any previous visit. ? ?No results found for this or  any previous visit. ? ?No results found for this or any previous visit. ? ?No results found for this or any previous visit. ? ?No results found for this or any previous visit. ? ?No results found for this or any previous visit. ? ?Results for orders placed during the hospital encounter of 03/31/21 ? ?CT RENAL STONE STUDY ? ?Narrative ?CLINICAL DATA:  Flank pain left-sided renal stone suspected. History ?of bladder cancer status post TURBT ? ?EXAM: ?CT ABDOMEN AND PELVIS WITHOUT CONTRAST ? ?  TECHNIQUE: ?Multidetector CT imaging of the abdomen and pelvis was performed ?following the standard protocol without IV contrast. ? ?RADIATION DOSE REDUCTION: This exam was performed according to the ?departmental dose-optimization program which includes automated ?exposure control, adjustment of the mA and/or kV according to ?patient size and/or use of iterative reconstruction technique. ? ?COMPARISON:  November 12, 2016 CT ? ?FINDINGS: ?Lower chest: No acute abnormality. ? ?Hepatobiliary: Hepatic steatosis. Cholelithiasis without findings of ?acute cholecystitis. No biliary ductal dilation. ? ?Pancreas: No pancreatic ductal dilation or evidence of acute ?inflammation. ? ?Spleen: No splenomegaly or focal splenic lesion. ? ?Adrenals/Urinary Tract: Bilateral adrenal glands appear normal. ? ?Mild left-sided hydroureteronephrosis to the level of a 5 mm ?ureteral stone at the pelvic inlet. Additional punctate ?nonobstructive left renal calculi. Right kidney is unremarkable ?without hydronephrosis or renal calculus. Urinary bladder is ?unremarkable for degree of distension. ? ?Stomach/Bowel: Stomach is within normal limits. Appendix appears ?normal. No evidence of bowel wall thickening, distention, or ?inflammatory changes. ? ?Vascular/Lymphatic: Normal caliber abdominal aorta. Aortic ?atherosclerosis. No pathologically enlarged abdominal or pelvic ?lymph nodes. ? ?Reproductive: Dystrophic calcifications in the enlarged  prostate ?gallium. ? ?Other: No significant abdominopelvic free fluid. ? ?Musculoskeletal: Multilevel degenerative changes spine. No ?aggressive lytic or blastic lesion of bone. ? ?IMPRESSION: ?1. Mild left-sided hyd

## 2021-04-03 NOTE — Telephone Encounter (Signed)
-----   Message from [name], MD sent at 04/03/2021  7:48 AM EDT ----- ?He probably needs to be seen in the next couple of weeks with a KUB for f/u of his stone if he hasn't passed it.   ?----- Message ----- ?From: Dorisann Frames, RN ?Sent: 03/31/2021  12:16 PM EDT ?To: [name], MD ? ?STAT ct ? ?

## 2021-04-03 NOTE — H&P (View-Only) (Signed)
? ?04/03/2021 ?3:35 PM  ? ?SHARONE ALMOND ?1960-12-16 ?[id number] ? ?Referring provider: Lindell Spar, MD ?[address] ?[place],  [place] [address] ? ?Left flank pain ? ? ?HPI: ?Mr Terpening is a 61yo here for evaluation of nephrolithiasis. This is his fourth stone event. Starting Thursday he developed severe, sharp, constant left flank pain. He underwent CT 03/31/2021 and was diagnosed with a 34m left proximal ureteral calculus. His pain continued to worsen and now he has nausea and vomiting. He denies any worsening LUTS. UA today shows >30 RBCs and pH of 5.5. No other associated symptoms. No exacerbating events. Pain alleviated with narcotics. KUB from today shows 532mcalculus over the pelvis.  ? ? ?PMH: ?Past Medical History:  ?Diagnosis Date  ? 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 Arthritis   ? bilateral fingers  ? Asthma   ? as child, no longer problematic  ? [place]  ? Status post surgery and immunotherapy 2018  ? Carpal tunnel syndrome on right 12/16/2019  ? Depression   ? Fatty liver   ? Hypercholesteremia   ? Irritable bowel syndrome   ? Nephrolithiasis   ? Prediabetes   ? Throat swelling   ? from tick  ? Tongue swelling   ? from tick  ? Urticaria   ? from tick  ? Varicella zoster   ? ? ?Surgical History: ?Past Surgical History:  ?Procedure Laterality Date  ? COLONOSCOPY N/A 09/09/2012  ? Procedure: COLONOSCOPY;  Surgeon: MaJamesetta SoMD;  [facility];  Service: Gastroenterology;  Laterality: N/A;  ? Left femur Left 1988  ? TRANSURETHRAL RESECTION OF BLADDER TUMOR N/A 12/18/2016  ? Procedure: CYSTOSCOPY TRANSURETHRAL RESECTION OF BLADDER TUMOR (TURBT);  Surgeon: WrIrine SealMD;  [facility];  Laterality: N/A;  ? ? ?Home Medications:  ?Allergies as of 04/03/2021   ? ?   Reactions  ? No Known Allergies   ? Other   ? No beef, pork, deer, products secondary from tick bite  ? ?  ? ?  ?Medication List  ?  ? ?  ? Accurate as of April 03, 2021  3:35 PM. If you have any questions, ask your nurse or doctor.  ?  ?  ? ?  ? ?Cetirizine HCl 10 MG Caps ?Take by mouth 2 (two) times daily. ?  ?escitalopram 5 MG tablet ?Commonly known as: LEXAPRO ?TAKE 1 TABLET BY MOUTH DAILY. ?  ?multivitamin capsule ?Take by mouth daily. ?  ?omega-3 acid ethyl esters 1 g capsule ?Commonly known as: LOVAZA ?Take 2 capsules (2 g total) by mouth 2 (two) times daily. ?  ?ondansetron 4 MG tablet ?Commonly known as: Zofran ?Take 1 tablet (4 mg total) by mouth every 8 (eight) hours as needed for nausea or vomiting. ?  ?oxyCODONE-acetaminophen 5-325 MG tablet ?Commonly known as: Percocet ?Take 1 tablet by mouth every 4 (four) hours as needed. ?  ?rosuvastatin 10 MG tablet ?Commonly known as: Crestor ?Take 1 tablet (10 mg total) by mouth daily. ?  ?tamsulosin 0.4 MG Caps capsule ?Commonly known as: FLOMAX ?TAKE 1 CAPSULE BY MOUTH ONCE DAILY ?  ?Vitamin D-3 125 MCG (5000 UT) Tabs ?Take 2 tablets by mouth daily. ?  ? ?  ? ? ?Allergies:  ?Allergies  ?Allergen Reactions  ? No Known Allergies   ? Other   ?  No beef, pork, deer, products secondary from tick bite  ? ? ?  Family History: ?Family History  ?Problem Relation Age of Onset  ? Alcoholism Mother   ? Cirrhosis Mother   ? Alcoholism Father   ? Broken bones Father   ? Pancreatic cancer Brother   ? Pancreatic disease Brother   ? Dementia Paternal Aunt   ? Dementia Paternal Uncle   ? Stroke Maternal Grandmother   ? Cancer Maternal Grandfather   ? Diabetes Maternal Grandfather   ? Emphysema Paternal Grandfather   ? Allergic rhinitis Neg Hx   ? Angioedema Neg Hx   ? Asthma Neg Hx   ? Atopy Neg Hx   ? Eczema Neg Hx   ? Immunodeficiency Neg Hx   ? Urticaria Neg Hx   ? ? ?Social History:  reports that he has never smoked. He has never used smokeless tobacco. He reports current alcohol use. He reports that he does not use drugs. ? ?ROS: ?All other review of systems were reviewed and are negative except what is noted above in HPI ? ?Physical  Exam: ?BP 134/80   Pulse 68   ?Constitutional:  Alert and oriented, No acute distress. ?HEENT: [place] AT, moist mucus membranes.  Trachea midline, no masses. ?Cardiovascular: No clubbing, cyanosis, or edema. ?Respiratory: Normal respiratory effort, no increased work of breathing. ?GI: Abdomen is soft, nontender, nondistended, no abdominal masses ?GU: No CVA tenderness.  ?Lymph: No cervical or inguinal lymphadenopathy. ?Skin: No rashes, bruises or suspicious lesions. ?Neurologic: Grossly intact, no focal deficits, moving all 4 extremities. ?Psychiatric: Normal mood and affect. ? ?Laboratory Data: ?Lab Results  ?Component Value Date  ? WBC 4.2 07/12/2020  ? HGB 15.3 07/12/2020  ? HCT 47.5 07/12/2020  ? MCV 81.5 07/12/2020  ? PLT 207 07/12/2020  ? ? ?Lab Results  ?Component Value Date  ? CREATININE 0.97 09/29/2020  ? ? ?Lab Results  ?Component Value Date  ? PSA 4.1 (H) 03/19/2019  ? ? ?No results found for: TESTOSTERONE ? ?Lab Results  ?Component Value Date  ? HGBA1C 5.4 07/12/2020  ? ? ?Urinalysis ?   ?Component Value Date/Time  ? COLORURINE YELLOW 11/02/2016 1000  ? APPEARANCEUR Clear 09/01/2020 1453  ? LABSPEC 1.018 11/02/2016 1000  ? PHURINE 5.0 11/02/2016 1000  ? GLUCOSEU Negative 09/01/2020 1453  ? HGBUR LARGE (A) 11/02/2016 1000  ? BILIRUBINUR Negative 09/01/2020 1453  ? [place] NEGATIVE 11/02/2016 1000  ? PROTEINUR Negative 09/01/2020 1453  ? PROTEINUR NEGATIVE 11/02/2016 1000  ? UROBILINOGEN 0.2 03/06/2019 1323  ? NITRITE Negative 09/01/2020 1453  ? NITRITE NEGATIVE 11/02/2016 1000  ? LEUKOCYTESUR Negative 09/01/2020 1453  ? ? ?Lab Results  ?Component Value Date  ? LABMICR Comment 09/01/2020  ? WBCUA 0-5 10/09/2019  ? LABEPIT 0-10 10/09/2019  ? BACTERIA None seen 10/09/2019  ? ? ?Pertinent Imaging: ?CT stone study 3/31 and KUB today: Images reviewed and discussed with the patient ?No results found for this or any previous visit. ? ?No results found for this or any previous visit. ? ?No results found for this or  any previous visit. ? ?No results found for this or any previous visit. ? ?No results found for this or any previous visit. ? ?No results found for this or any previous visit. ? ?No results found for this or any previous visit. ? ?Results for orders placed during the hospital encounter of 03/31/21 ? ?CT RENAL STONE STUDY ? ?Narrative ?CLINICAL DATA:  Flank pain left-sided renal stone suspected. History ?of bladder cancer status post TURBT ? ?EXAM: ?CT ABDOMEN AND PELVIS WITHOUT CONTRAST ? ?  TECHNIQUE: ?Multidetector CT imaging of the abdomen and pelvis was performed ?following the standard protocol without IV contrast. ? ?RADIATION DOSE REDUCTION: This exam was performed according to the ?departmental dose-optimization program which includes automated ?exposure control, adjustment of the mA and/or kV according to ?patient size and/or use of iterative reconstruction technique. ? ?COMPARISON:  November 12, 2016 CT ? ?FINDINGS: ?Lower chest: No acute abnormality. ? ?Hepatobiliary: Hepatic steatosis. Cholelithiasis without findings of ?acute cholecystitis. No biliary ductal dilation. ? ?Pancreas: No pancreatic ductal dilation or evidence of acute ?inflammation. ? ?Spleen: No splenomegaly or focal splenic lesion. ? ?Adrenals/Urinary Tract: Bilateral adrenal glands appear normal. ? ?Mild left-sided hydroureteronephrosis to the level of a 5 mm ?ureteral stone at the pelvic inlet. Additional punctate ?nonobstructive left renal calculi. Right kidney is unremarkable ?without hydronephrosis or renal calculus. Urinary bladder is ?unremarkable for degree of distension. ? ?Stomach/Bowel: Stomach is within normal limits. Appendix appears ?normal. No evidence of bowel wall thickening, distention, or ?inflammatory changes. ? ?Vascular/Lymphatic: Normal caliber abdominal aorta. Aortic ?atherosclerosis. No pathologically enlarged abdominal or pelvic ?lymph nodes. ? ?Reproductive: Dystrophic calcifications in the enlarged  prostate ?gallium. ? ?Other: No significant abdominopelvic free fluid. ? ?Musculoskeletal: Multilevel degenerative changes spine. No ?aggressive lytic or blastic lesion of bone. ? ?IMPRESSION: ?1. Mild left-sided hyd

## 2021-04-03 NOTE — Telephone Encounter (Signed)
Patient still presenting with severe flank pain and nausea. Reviewed with MD on call- Dr. Alyson Ingles. Patient had KUB and will come in office this afternoon to discuss lithotripsy.  ?

## 2021-04-03 NOTE — Patient Instructions (Signed)
Dear Mr. Hase, ?  ?Thank you for choosing [facility] to assist in your urologic care for your upcoming surgery. The following information below includes specific dates and details related to surgery: ?  ?The Surgical Procedure you are scheduled to have performed is Extracorporeal shock wave lithotripsy  ?  ?Surgery Date: 04/04/2021 ?  ?Physician performing the surgery: Dr. Nicolette Bang ?  ?Do not eat or drink after midnight the day before your surgery.  ?  ?You will need a driver the day of surgery and will not be able to operate heavy machinery for 24 hours after.  ?  ?Your surgery will be performed at  ?[address] ?[place], [place] [address] ?  ?Enter at the [employer] and check in at the [facility] desk.   ?  ?Pre-Admit Testing Info ?  ?Pre- Admit appointments are interview with an anesthesiologist or a pre-operative anesthesia nurse. These appointments are typically completed as an in person visit but can take place over the telephone.  You will be contacted to confirm the date and time window.  ?  ?If you have any questions or concerns, please don't hesitate to call the office at [phone] ?  ?Thank you,  ?  ?Gibson Ramp, RN ?Clinical Surgery Coordinator ?[facility]  ?

## 2021-04-04 ENCOUNTER — Encounter (HOSPITAL_COMMUNITY)
Admission: RE | Disposition: A | Discharge status: Home / Self Care | Payer: Self-pay | Source: Home / Self Care | Attending: Urology

## 2021-04-04 ENCOUNTER — Ambulatory Visit (HOSPITAL_COMMUNITY)
Admission: RE | Admit: 2021-04-04 | Discharge: 2021-04-04 | Disposition: A | Discharge status: Home / Self Care | Payer: Commercial Managed Care - PPO | Attending: Urology | Admitting: Urology

## 2021-04-04 ENCOUNTER — Other Ambulatory Visit: Payer: Self-pay

## 2021-04-04 ENCOUNTER — Ambulatory Visit (HOSPITAL_COMMUNITY): Payer: Commercial Managed Care - PPO

## 2021-04-04 ENCOUNTER — Encounter (HOSPITAL_COMMUNITY): Payer: Self-pay | Admitting: Urology

## 2021-04-04 DIAGNOSIS — R7303 Prediabetes: Secondary | ICD-10-CM | POA: Insufficient documentation

## 2021-04-04 DIAGNOSIS — Z8551 Personal history of malignant neoplasm of bladder: Secondary | ICD-10-CM | POA: Diagnosis not present

## 2021-04-04 DIAGNOSIS — N201 Calculus of ureter: Secondary | ICD-10-CM | POA: Insufficient documentation

## 2021-04-04 DIAGNOSIS — N200 Calculus of kidney: Secondary | ICD-10-CM

## 2021-04-04 HISTORY — PX: EXTRACORPOREAL SHOCK WAVE LITHOTRIPSY: SHX1557

## 2021-04-04 SURGERY — LITHOTRIPSY, ESWL
Anesthesia: LOCAL | Laterality: Left

## 2021-04-04 MED ORDER — DIAZEPAM 5 MG PO TABS
10.0000 mg | ORAL_TABLET | Freq: Once | ORAL | Status: AC
  Administered 2021-04-04: 10 mg via ORAL
  Filled 2021-04-04: qty 2

## 2021-04-04 MED ORDER — DIPHENHYDRAMINE HCL 25 MG PO CAPS
25.0000 mg | ORAL_CAPSULE | ORAL | Status: AC
  Administered 2021-04-04: 25 mg via ORAL
  Filled 2021-04-04: qty 1

## 2021-04-04 MED ORDER — TAMSULOSIN HCL 0.4 MG PO CAPS: ORAL_CAPSULE | Freq: Every day | ORAL | 3 refills | Status: DC

## 2021-04-04 MED ORDER — SODIUM CHLORIDE 0.9 % IV SOLN
Freq: Once | INTRAVENOUS | Status: AC
  Administered 2021-04-04: 1000 mL via INTRAVENOUS

## 2021-04-04 MED ORDER — ONDANSETRON HCL 4 MG PO TABS: 4.0000 mg | ORAL_TABLET | Freq: Three times a day (TID) | ORAL | 0 refills | Status: DC | PRN

## 2021-04-04 MED ORDER — OXYCODONE-ACETAMINOPHEN 5-325 MG PO TABS: 1.0000 | ORAL_TABLET | ORAL | 0 refills | Status: DC | PRN

## 2021-04-05 ENCOUNTER — Encounter (HOSPITAL_COMMUNITY): Payer: Self-pay | Admitting: Urology

## 2021-04-06 NOTE — Interval H&P Note (Signed)
History and Physical Interval Note: ? ?04/06/2021 ?2:42 PM ? ?Lee Hughes  has presented today for surgery, with the diagnosis of left ureteral calculus.  The various methods of treatment have been discussed with the patient and family. After consideration of risks, benefits and other options for treatment, the patient has consented to  Procedure(s) with comments: ?EXTRACORPOREAL SHOCK WAVE LITHOTRIPSY (ESWL) (Left) - pt being told by office to be NPO and arrive at 10:30 - no PAT as a surgical intervention.  The patient's history has been reviewed, patient examined, no change in status, stable for surgery.  I have reviewed the patient's chart and labs.  Questions were answered to the patient's satisfaction.   ? ? ?Nicolette Bang ? ? ?

## 2021-04-18 ENCOUNTER — Ambulatory Visit (HOSPITAL_COMMUNITY)
Admission: RE | Admit: 2021-04-18 | Discharge: 2021-04-18 | Disposition: A | Discharge status: Home / Self Care | Payer: Commercial Managed Care - PPO | Source: Ambulatory Visit | Attending: Urology | Admitting: Urology

## 2021-04-18 DIAGNOSIS — N200 Calculus of kidney: Secondary | ICD-10-CM | POA: Diagnosis present

## 2021-04-19 ENCOUNTER — Ambulatory Visit: Payer: Commercial Managed Care - PPO | Admitting: Physician Assistant

## 2021-04-20 ENCOUNTER — Ambulatory Visit: Payer: Commercial Managed Care - PPO | Admitting: Physician Assistant

## 2021-04-24 ENCOUNTER — Ambulatory Visit: Payer: 59 | Admitting: Internal Medicine

## 2021-04-24 ENCOUNTER — Ambulatory Visit: Payer: Commercial Managed Care - PPO | Admitting: Physician Assistant

## 2021-04-26 ENCOUNTER — Ambulatory Visit: Payer: Commercial Managed Care - PPO | Admitting: Physician Assistant

## 2021-04-26 VITALS — BP 137/65 | HR 71 | Ht 71.0 in | Wt 198.0 lb

## 2021-04-26 DIAGNOSIS — N201 Calculus of ureter: Secondary | ICD-10-CM

## 2021-04-26 DIAGNOSIS — N200 Calculus of kidney: Secondary | ICD-10-CM

## 2021-04-26 LAB — URINALYSIS, ROUTINE W REFLEX MICROSCOPIC
Bilirubin, UA: NEGATIVE
Glucose, UA: NEGATIVE
Ketones, UA: NEGATIVE
Leukocytes,UA: NEGATIVE
Nitrite, UA: NEGATIVE
Protein,UA: NEGATIVE
RBC, UA: NEGATIVE
Specific Gravity, UA: >1.03 — ABNORMAL HIGH (ref 1.005–1.030)
Urobilinogen, Ur: 0.2 mg/dL (ref 0.2–1.0)
pH, UA: 6 (ref 5.0–7.5)

## 2021-04-26 MED ORDER — SILODOSIN 8 MG PO CAPS: 8.0000 mg | ORAL_CAPSULE | Freq: Every day | ORAL | 2 refills | Status: DC

## 2021-04-26 NOTE — Progress Notes (Signed)
? ?Assessment: ?1. Nephrolithiasis ?- Urinalysis, Routine w reflex microscopic ?- Abdomen 1 view (KUB) ? ?2. Left ureteral stone ?- Abdomen 1 view (KUB) ?  ? ?Plan: ?We will repeat KUB and pending results, will consider stone manipulation.  We will discontinue Flomax and start Rapaflo.  He will continue to strain his urine. Renal US in 6 weeks, then OV ? ? ?Chief Complaint: ?No chief complaint on file. ? ? ?HPI: ?Lee Hughes is a 61 y.o. male who presents for continued evaluation of left sided ureteral stone post EWSL on 04/04/21. He has not passed stone fragments and continues to have significant left sided flank pain. NO fever, chills, NV. No gross hematuria. ? ?UA is clear today ?Portions of the above documentation were copied from a prior visit for review purposes only. ? ?Allergies: ?Allergies  ?Allergen Reactions  ? No Known Allergies   ? Other   ?  No beef, pork, deer, products secondary from tick bite  ? ? ?PMH: ?Past Medical History:  ?Diagnosis Date  ? 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 Arthritis   ? bilateral fingers  ? Asthma   ? as child, no longer problematic  ? Bladder cancer ([place])   ? Status post surgery and immunotherapy 2018  ? Carpal tunnel syndrome on right 12/16/2019  ? Depression   ? Fatty liver   ? Hypercholesteremia   ? Irritable bowel syndrome   ? Nephrolithiasis   ? Prediabetes   ? Throat swelling   ? from tick  ? Tongue swelling   ? from tick  ? Urticaria   ? from tick  ? Varicella zoster   ? ? ?PSH: ?Past Surgical History:  ?Procedure Laterality Date  ? COLONOSCOPY N/A 09/09/2012  ? Procedure: COLONOSCOPY;  Surgeon: Jamesetta So, MD;  [facility];  Service: Gastroenterology;  Laterality: N/A;  ? EXTRACORPOREAL SHOCK WAVE LITHOTRIPSY Left 04/04/2021  ? Procedure: EXTRACORPOREAL SHOCK WAVE LITHOTRIPSY (ESWL);  Surgeon: Cleon Gustin, MD;  Location: [facility];  Service: Urology;  Laterality: Left;  pt being told by office to be NPO and arrive at 10:30 - no PAT  ?  Left femur Left 1988  ? TRANSURETHRAL RESECTION OF BLADDER TUMOR N/A 12/18/2016  ? Procedure: CYSTOSCOPY TRANSURETHRAL RESECTION OF BLADDER TUMOR (TURBT);  Surgeon: [name], MD;  Location[facility];  Service: Urology;  Laterality: N/A;  ? ? ?SH: ?Social History  ? ?Tobacco Use  ? Smoking status: Never  ? Smokeless tobacco: Never  ?Vaping Use  ? Vaping Use: Never used  ?Substance Use Topics  ? Alcohol use: Yes  ?  Comment: occas  ? Drug use: Never  ? ? ?ROS: ?See HPI ? ?PE: ?BP 137/65   Pulse 71   Ht '5\' 11"'$  (1.803 m)   Wt 198 lb (89.8 kg)   BMI 27.62 kg/m?  ?GENERAL APPEARANCE:  Well appearing, well developed, well nourished, appears uncomfy ?HEENT:  Atraumatic, normocephalic ?NECK:  Supple. Trachea midline ?ABDOMEN:  Soft, non-tender, no masses ?MENTAL STATUS:  appropriate ?BACK: left sided CVAT ?SKIN:  Warm, dry, and intact ? ? ?Results: ?Laboratory Data: ?Lab Results  ?Component Value Date  ? WBC 4.2 07/12/2020  ? HGB 15.3 07/12/2020  ? HCT 47.5 07/12/2020  ? MCV 81.5 07/12/2020  ? PLT 207 07/12/2020  ? ? ?Lab Results  ?Component Value Date  ? CREATININE 0.97 09/29/2020  ? ? ?Lab Results  ?Component Value Date  ? PSA 4.1 (  H) 03/19/2019  ? ? ?No results found for: TESTOSTERONE ? ?Lab Results  ?Component Value Date  ? HGBA1C 5.4 07/12/2020  ? ? ?Urinalysis ?   ?Component Value Date/Time  ? COLORURINE YELLOW 11/02/2016 1000  ? APPEARANCEUR Clear 04/03/2021 1523  ? LABSPEC 1.018 11/02/2016 1000  ? PHURINE 5.0 11/02/2016 1000  ? GLUCOSEU Negative 04/03/2021 1523  ? HGBUR LARGE (A) 11/02/2016 1000  ? BILIRUBINUR Negative 04/03/2021 1523  ? [place] NEGATIVE 11/02/2016 1000  ? PROTEINUR Negative 04/03/2021 1523  ? PROTEINUR NEGATIVE 11/02/2016 1000  ? UROBILINOGEN 0.2 03/06/2019 1323  ? NITRITE Negative 04/03/2021 1523  ? NITRITE NEGATIVE 11/02/2016 1000  ? LEUKOCYTESUR Negative 04/03/2021 1523  ? ? ?Lab Results  ?Component Value Date  ? LABMICR See below: 04/03/2021  ? [place] None seen  04/03/2021  ? LABEPIT None seen 04/03/2021  ? MUCUS Present (A) 04/03/2021  ? BACTERIA None seen 04/03/2021  ? ? ?Pertinent Imaging: ? ?Results for orders placed during the hospital encounter of 04/18/21 ? ?DG Abd 1 View ? ?Narrative ?CLINICAL DATA:  Post lithotripsy.  No pain. ? ?EXAM: ?ABDOMEN - 1 VIEW ? ?COMPARISON:  04/04/2021.  CT, 03/31/2021. ? ?FINDINGS: ?Small stone, 3-4 mm, projects in the left mid pelvis, just lateral ?to the mid sacrum, not evident on the prior exams. This could ?reflect the left ureteral stone having moved distally. There is a ?vascular calcification in this location on the prior CT, however. ? ?There is no other evidence of a ureteral stone. No evidence of an ?intrarenal stone. Stable prostate calcifications. ? ?Normal bowel gas pattern.  Skeletal structures are unremarkable. ? ?IMPRESSION: ?1. Possible 3-4 mm left ureteral stone, in the left pelvis. This ?could reflect the ureteral stone seen on the previous CT, passed ?more inferiorly. ?2. No other evidence of a ureteral stone and no evidence of an ?intrarenal stone. ? ? ?Electronically Signed ?By: Lajean Manes M.D. ?On: 04/20/2021 14:23 ? ?No results found for this or any previous visit. ? ?No results found for this or any previous visit. ? ?No results found for this or any previous visit. ? ?No results found for this or any previous visit. ? ?No results found for this or any previous visit. ? ?No results found for this or any previous visit. ? ?Results for orders placed during the hospital encounter of 03/31/21 ? ?CT RENAL STONE STUDY ? ?Narrative ?CLINICAL DATA:  Flank pain left-sided renal stone suspected. History ?of bladder cancer status post TURBT ? ?EXAM: ?CT ABDOMEN AND PELVIS WITHOUT CONTRAST ? ?TECHNIQUE: ?Multidetector CT imaging of the abdomen and pelvis was performed ?following the standard protocol without IV contrast. ? ?RADIATION DOSE REDUCTION: This exam was performed according to the ?departmental dose-optimization  program which includes automated ?exposure control, adjustment of the mA and/or kV according to ?patient size and/or use of iterative reconstruction technique. ? ?COMPARISON:  November 12, 2016 CT ? ?FINDINGS: ?Lower chest: No acute abnormality. ? ?Hepatobiliary: Hepatic steatosis. Cholelithiasis without findings of ?acute cholecystitis. No biliary ductal dilation. ? ?Pancreas: No pancreatic ductal dilation or evidence of acute ?inflammation. ? ?Spleen: No splenomegaly or focal splenic lesion. ? ?Adrenals/Urinary Tract: Bilateral adrenal glands appear normal. ? ?Mild left-sided hydroureteronephrosis to the level of a 5 mm ?ureteral stone at the pelvic inlet. Additional punctate ?nonobstructive left renal calculi. Right kidney is unremarkable ?without hydronephrosis or renal calculus. Urinary bladder is ?unremarkable for degree of distension. ? ?Stomach/Bowel: Stomach is within normal limits. Appendix appears ?normal. No evidence of bowel wall thickening, distention,  or ?inflammatory changes. ? ?Vascular/Lymphatic: Normal caliber abdominal aorta. Aortic ?atherosclerosis. No pathologically enlarged abdominal or pelvic ?lymph nodes. ? ?Reproductive: Dystrophic calcifications in the enlarged prostate ?gallium. ? ?Other: No significant abdominopelvic free fluid. ? ?Musculoskeletal: Multilevel degenerative changes spine. No ?aggressive lytic or blastic lesion of bone. ? ?IMPRESSION: ?1. Mild left-sided hydroureteronephrosis to the level of a 5 mm ?ureteral stone at the pelvic inlet. ?2. Additional punctate nonobstructive left renal calculi. ?3. Hepatic steatosis. ?4. Cholelithiasis without findings of acute cholecystitis. ?5. Aortic Atherosclerosis (ICD10-I70.0). ? ? ?Electronically Signed ?By: Dahlia Bailiff M.D. ?On: 03/31/2021 12:03 ? ?No results found for this or any previous visit (from the past 24 hour(s)).  ?

## 2021-05-01 ENCOUNTER — Ambulatory Visit (HOSPITAL_COMMUNITY)
Admission: RE | Admit: 2021-05-01 | Discharge: 2021-05-01 | Disposition: A | Discharge status: Home / Self Care | Payer: Commercial Managed Care - PPO | Source: Ambulatory Visit | Attending: Physician Assistant | Admitting: Physician Assistant

## 2021-05-01 DIAGNOSIS — N200 Calculus of kidney: Secondary | ICD-10-CM | POA: Insufficient documentation

## 2021-05-01 DIAGNOSIS — N201 Calculus of ureter: Secondary | ICD-10-CM | POA: Diagnosis present

## 2021-05-02 ENCOUNTER — Other Ambulatory Visit: Payer: Self-pay

## 2021-05-02 ENCOUNTER — Encounter (HOSPITAL_COMMUNITY): Payer: Self-pay | Admitting: Urology

## 2021-05-02 DIAGNOSIS — N200 Calculus of kidney: Secondary | ICD-10-CM

## 2021-05-04 NOTE — Progress Notes (Signed)
Patient scheduled for f/u with Dr. Alyson Ingles.  Wife aware renal US will need to be completed within a week of his next apt. ?

## 2021-05-07 LAB — CALCULI, WITH PHOTOGRAPH (CLINICAL LAB)
Calcium Oxalate Dihydrate: 20 %
Calcium Oxalate Monohydrate: 75 %
Hydroxyapatite: 5 %
Weight Calculi: 42 mg

## 2021-05-07 LAB — SPECIMEN STATUS REPORT

## 2021-06-21 ENCOUNTER — Ambulatory Visit: Payer: Commercial Managed Care - PPO | Admitting: Physician Assistant

## 2021-06-21 ENCOUNTER — Encounter: Payer: Self-pay | Admitting: Physician Assistant

## 2021-06-21 DIAGNOSIS — D485 Neoplasm of uncertain behavior of skin: Secondary | ICD-10-CM

## 2021-06-21 NOTE — Patient Instructions (Signed)

## 2021-06-21 NOTE — Progress Notes (Signed)
   New Patient   Subjective  Lee Hughes is a 61 y.o. male who presents for the following: New Patient (Initial Visit) (Skin check).   The following portions of the chart were reviewed this encounter and updated as appropriate:  Tobacco  Allergies  Meds  Problems  Med Hx  Surg Hx  Fam Hx      Objective  Well appearing patient in no apparent distress; mood and affect are within normal limits.  A full examination was performed including scalp, head, eyes, ears, nose, lips, neck, chest, axillae, abdomen, back, buttocks, bilateral upper extremities, bilateral lower extremities, hands, feet, fingers, toes, fingernails, and toenails. All findings within normal limits unless otherwise noted below.  Left Mid Upper Back Brown macule with erythematous base.         Assessment & Plan  Neoplasm of uncertain behavior of skin Left Mid Upper Back  Skin / nail biopsy Type of biopsy: tangential   Informed consent: discussed and consent obtained   Timeout: patient name, date of birth, surgical site, and procedure verified   Anesthesia: the lesion was anesthetized in a standard fashion   Anesthetic:  1% lidocaine w/ epinephrine 1-100,000 local infiltration Instrument used: flexible razor blade   Hemostasis achieved with: aluminum chloride and electrodesiccation   Outcome: patient tolerated procedure well   Post-procedure details: sterile dressing applied and wound care instructions given   Dressing type: petrolatum and bandage    Specimen 1 - Surgical pathology Differential Diagnosis: R/O Atypia - cautery after biopsy  Check Margins: yes     I, Waymon Laser, PA-C, have reviewed all documentation's for this visit.  The documentation on 06/21/21 for the exam, diagnosis, procedures and orders are all accurate and complete.

## 2021-06-26 ENCOUNTER — Ambulatory Visit: Payer: Commercial Managed Care - PPO | Admitting: Urology

## 2021-08-31 ENCOUNTER — Other Ambulatory Visit: Payer: 59

## 2021-09-07 ENCOUNTER — Other Ambulatory Visit: Payer: 59 | Admitting: Urology

## 2021-09-21 ENCOUNTER — Ambulatory Visit: Payer: 59 | Admitting: Urology

## 2021-09-21 VITALS — BP 132/74 | HR 57

## 2021-09-21 DIAGNOSIS — N401 Enlarged prostate with lower urinary tract symptoms: Secondary | ICD-10-CM | POA: Diagnosis not present

## 2021-09-21 DIAGNOSIS — R3912 Poor urinary stream: Secondary | ICD-10-CM | POA: Diagnosis not present

## 2021-09-21 DIAGNOSIS — Z87442 Personal history of urinary calculi: Secondary | ICD-10-CM

## 2021-09-21 DIAGNOSIS — R972 Elevated prostate specific antigen [PSA]: Secondary | ICD-10-CM

## 2021-09-21 DIAGNOSIS — Z8551 Personal history of malignant neoplasm of bladder: Secondary | ICD-10-CM

## 2021-09-21 DIAGNOSIS — R351 Nocturia: Secondary | ICD-10-CM | POA: Diagnosis not present

## 2021-09-21 DIAGNOSIS — N5201 Erectile dysfunction due to arterial insufficiency: Secondary | ICD-10-CM

## 2021-09-21 DIAGNOSIS — N138 Other obstructive and reflux uropathy: Secondary | ICD-10-CM

## 2021-09-21 MED ORDER — CIPROFLOXACIN HCL 500 MG PO TABS
500.0000 mg | ORAL_TABLET | Freq: Once | ORAL | Status: AC
  Administered 2021-09-21: 500 mg via ORAL

## 2021-09-21 MED ORDER — TAMSULOSIN HCL 0.4 MG PO CAPS: 0.4000 mg | ORAL_CAPSULE | Freq: Every day | ORAL | 3 refills | Status: DC

## 2021-09-21 NOTE — Progress Notes (Signed)
Subjective:  1. History of bladder cancer   2. BPH with urinary obstruction   3. Weak urinary stream   4. Nocturia   5. Elevated PSA   6. Erectile dysfunction due to arterial insufficiency   7. History of urinary stone     09/21/21: Amaurie returns today in f/u for his history of bladder cancer and returns for cystoscopy. He has had no hematuria. He remains on tamsulosin for BPH with BOO.     He has some urgency with mild UUI.  He has a reduced stream.  He has had no hematuria.  He had BCG maintenance with the last treatment on 10/09/19.    UA is clear today.   He had a TURBT of a 3cm tumor on 12/18/16 from the left lateral wall. It was multifocal Ta primarily low grade disease with some foci of high risk features. He completed induction BCG on 02/22/17   He required ESWL for a stone in 4/23.   He has a history of an elevated PSA but the last was 2.1 in 9/22.  I will repeat that today.   He has progressive ED but has not tried the sildenafil.          ROS:  ROS:  A complete review of systems was performed.  All systems are negative except for pertinent findings as noted.   Review of Systems  All other systems reviewed and are negative.   Allergies  Allergen Reactions   No Known Allergies    Other     No beef, pork, deer, products secondary from tick bite    Outpatient Encounter Medications as of 09/21/2021  Medication Sig   tamsulosin (FLOMAX) 0.4 MG CAPS capsule Take 1 capsule (0.4 mg total) by mouth daily.   Cetirizine HCl 10 MG CAPS Take by mouth 2 (two) times daily.    Cholecalciferol (VITAMIN D-3) 125 MCG (5000 UT) TABS Take 2 tablets by mouth daily.    escitalopram (LEXAPRO) 5 MG tablet TAKE 1 TABLET BY MOUTH DAILY.   Multiple Vitamin (MULTIVITAMIN) capsule Take by mouth daily.   omega-3 acid ethyl esters (LOVAZA) 1 g capsule Take 2 capsules (2 g total) by mouth 2 (two) times daily.   [DISCONTINUED] ondansetron (ZOFRAN) 4 MG tablet Take 1 tablet (4 mg total) by  mouth every 8 (eight) hours as needed for nausea or vomiting. (Patient not taking: Reported on 04/26/2021)   [DISCONTINUED] oxyCODONE-acetaminophen (PERCOCET) 5-325 MG tablet Take 1 tablet by mouth every 4 (four) hours as needed. (Patient not taking: Reported on 04/26/2021)   [DISCONTINUED] silodosin (RAPAFLO) 8 MG CAPS capsule Take 1 capsule (8 mg total) by mouth daily with breakfast. (Patient not taking: Reported on 06/21/2021)   [EXPIRED] ciprofloxacin (CIPRO) tablet 500 mg    No facility-administered encounter medications on file as of 09/21/2021.    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   Arthritis    bilateral fingers   Asthma    as child, no longer problematic   Bladder cancer ([place])    Status post surgery and immunotherapy 2018   Carpal tunnel syndrome on right 12/16/2019   Depression    Fatty liver    Hypercholesteremia    Irritable bowel syndrome    Nephrolithiasis    Prediabetes    Throat swelling    from tick   Tongue swelling    from tick   Urticaria    from tick  Varicella zoster     Past Surgical History:  Procedure Laterality Date   COLONOSCOPY N/A 09/09/2012   Procedure: COLONOSCOPY;  Surgeon: Jamesetta So, MD;  [facility];  Service: Gastroenterology;  Laterality: N/A;   EXTRACORPOREAL SHOCK WAVE LITHOTRIPSY Left 04/04/2021   Procedure: EXTRACORPOREAL SHOCK WAVE LITHOTRIPSY (ESWL);  Surgeon: Cleon Gustin, MD;  Location: [facility];  Service: Urology;  Laterality: Left;  pt being told by office to be NPO and arrive at 10:30 - no PAT   Left femur Left 1988   TRANSURETHRAL RESECTION OF BLADDER TUMOR N/A 12/18/2016   Procedure: CYSTOSCOPY TRANSURETHRAL RESECTION OF BLADDER TUMOR (TURBT);  Surgeon: [name], MD;  Location: [facility];  Service: Urology;  Laterality: N/A;    Social History   Socioeconomic History   Marital status: Married    Spouse name: [name]   Number of children:  Not on file   Years of education: Not on file   Highest education level: Associate degree: academic program  Occupational History   Not on file  Tobacco Use   Smoking status: Never   Smokeless tobacco: Never  Vaping Use   Vaping Use: Never used  Substance and Sexual Activity   Alcohol use: Yes    Comment: occas   Drug use: Never   Sexual activity: Not Currently  Other Topics Concern   Not on file  Social History Narrative   Married 19 years. Lives with wife. 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   Social Determinants of Health   Financial Resource Strain: Not on file  Food Insecurity: Not on file  Transportation Needs: Not on file  Physical Activity: Not on file  Stress: Not on file  Social Connections: Not on file  Intimate Partner Violence: Not on file    Family History  Problem Relation Age of Onset   Alcoholism Mother    Cirrhosis Mother    Alcoholism Father    Broken bones Father    Pancreatic cancer Brother    Pancreatic disease Brother    Dementia Paternal Aunt    Dementia Paternal Uncle    Stroke Maternal Grandmother    Cancer Maternal Grandfather    Diabetes Maternal Grandfather    Emphysema Paternal Grandfather    Allergic rhinitis Neg Hx    Angioedema Neg Hx    Asthma Neg Hx    Atopy Neg Hx    Eczema Neg Hx    Immunodeficiency Neg Hx    Urticaria Neg Hx        Objective: Vitals:   09/21/21 1509  BP: 132/74  Pulse: (!) 57     Physical Exam  Lab Results:  No results found for this or any previous visit (from the past 24 hour(s)).   BMET No results for input(s): "NA", "K", "CL", "CO2", "GLUCOSE", "BUN", "CREATININE", "CALCIUM" in the last 72 hours. PSA PSA  Date Value Ref Range Status  03/19/2019 4.1 (H) < OR = 4.0 ng/mL Final    Comment:    The total PSA value from this assay system is  standardized against the WHO standard. The test  result will be approximately 20% lower when compared  to the equimolar-standardized total PSA (Beckman   Coulter). Comparison of serial PSA results should be  interpreted with this fact in mind. . This test was performed using the Siemens  chemiluminescent method. Values obtained from  different assay methods cannot be used interchangeably. PSA levels, regardless of value, should not be interpreted as absolute  evidence of the presence or absence of disease.    Prostate Specific Ag, Serum  Date Value Ref Range Status  09/29/2020 2.1 0.0 - 4.0 ng/mL Final    Comment:    Roche ECLIA methodology. According to the American Urological Association, Serum PSA should decrease and remain at undetectable levels after radical prostatectomy. The AUA defines biochemical recurrence as an initial PSA value 0.2 ng/mL or greater followed by a subsequent confirmatory PSA value 0.2 ng/mL or greater. Values obtained with different assay methods or kits cannot be used interchangeably. Results cannot be interpreted as absolute evidence of the presence or absence of malignant disease.   12/15/2019 3.0 0.0 - 4.0 ng/mL Final    Comment:    Roche ECLIA methodology. According to the American Urological Association, Serum PSA should decrease and remain at undetectable levels after radical prostatectomy. The AUA defines biochemical recurrence as an initial PSA value 0.2 ng/mL or greater followed by a subsequent confirmatory PSA value 0.2 ng/mL or greater. Values obtained with different assay methods or kits cannot be used interchangeably. Results cannot be interpreted as absolute evidence of the presence or absence of malignant disease.     No results found for: "TESTOSTERONE"  Lab Results  Component Value Date   PSA1 2.1 09/29/2020   PSA1 3.0 12/15/2019     Studies/Results: Cystoscopy:   He was prepped with betadine, draped with sterile towels and the urethra was instilled with 2% lidocaine.  Cipro '500mg'$  po given today.   Urethra: normal.  Prostate: 3cm with bilobar hyperplasia with  obstruction.  No middle lobe.  Bladder: Mild trabeculation without tumors or stones.   Ureteral orifices: normal.  Complications: none.       Assessment & Plan: 1. History of bladder cancer.   No recurrence noted on cystoscopy.   CT stone study in 4/23 showed nothing to suggest upper tract disease.   He will need repeat cystoscopy in a year.   2. BPH with BOO and mild LUTS but with occasional urgency with minimal UUI. Tamsulosin refilled.    His PSA was back down to 2.1 last year and will be repeated today.     3. History of stones.   KUB in a year.    Meds ordered this encounter  Medications   ciprofloxacin (CIPRO) tablet 500 mg   tamsulosin (FLOMAX) 0.4 MG CAPS capsule    Sig: Take 1 capsule (0.4 mg total) by mouth daily.    Dispense:  90 capsule    Refill:  3     Orders Placed This Encounter  Procedures   DG Abd 1 View    Standing Status:   Future    Standing Expiration Date:   09/22/2022    Order Specific Question:   Reason for Exam (SYMPTOM  OR DIAGNOSIS REQUIRED)    Answer:   history of stones    Order Specific Question:   Preferred imaging location?    Answer:   [facility]    Order Specific Question:   Radiology Contrast Protocol - do NOT remove file path    Answer:   [email or web address]   Urinalysis, Routine w reflex microscopic   PSA, total and free    Standing Status:   Future    Number of Occurrences:   1    Standing Expiration Date:   09/22/2022      Return in about 1 year (around 09/22/2022) for with PSA for cystoscopy. .   CC: Dr. Gwynneth Albright MD  [name] 09/22/2021 Patient ID: Lee Hughes, male   DOB: June 13, 1960, 61 y.o.   MRN: [id number]

## 2021-09-22 LAB — URINALYSIS, ROUTINE W REFLEX MICROSCOPIC
Bilirubin, UA: NEGATIVE
Glucose, UA: NEGATIVE
Ketones, UA: NEGATIVE
Leukocytes,UA: NEGATIVE
Nitrite, UA: NEGATIVE
Protein,UA: NEGATIVE
RBC, UA: NEGATIVE
Specific Gravity, UA: 1.015 (ref 1.005–1.030)
Urobilinogen, Ur: 0.2 mg/dL (ref 0.2–1.0)
pH, UA: 6 (ref 5.0–7.5)

## 2021-09-23 LAB — PSA, TOTAL AND FREE
PSA, Free Pct: 29.3 %
PSA, Free: 0.44 ng/mL
Prostate Specific Ag, Serum: 1.5 ng/mL (ref 0.0–4.0)

## 2021-09-25 ENCOUNTER — Telehealth: Payer: Self-pay

## 2021-09-25 NOTE — Telephone Encounter (Signed)
-----   Message from [name], MD sent at 09/25/2021  1:18 PM EDT ----- His PSA is down a bit more to 1.5.  ----- Message ----- From: Sherrilyn Rist, CMA Sent: 09/25/2021  10:58 AM EDT To: [name], MD  Please Review

## 2021-09-25 NOTE — Telephone Encounter (Signed)
Call patient with no answer. Will try back later.

## 2021-09-25 NOTE — Telephone Encounter (Signed)
Made patient's wife aware that patient PSA is down to 1.5. Wife voiced understanding

## 2022-02-04 ENCOUNTER — Other Ambulatory Visit: Payer: Self-pay | Admitting: Urology

## 2022-02-18 IMAGING — DX DG WRIST COMPLETE 3+V*L*
4 series · 4 of 4 positions shown · non-contrast
Comparison: None.

CLINICAL DATA: Left wrist pain

EXAM:
LEFT WRIST - COMPLETE 3+ VIEW

[wrist pa]
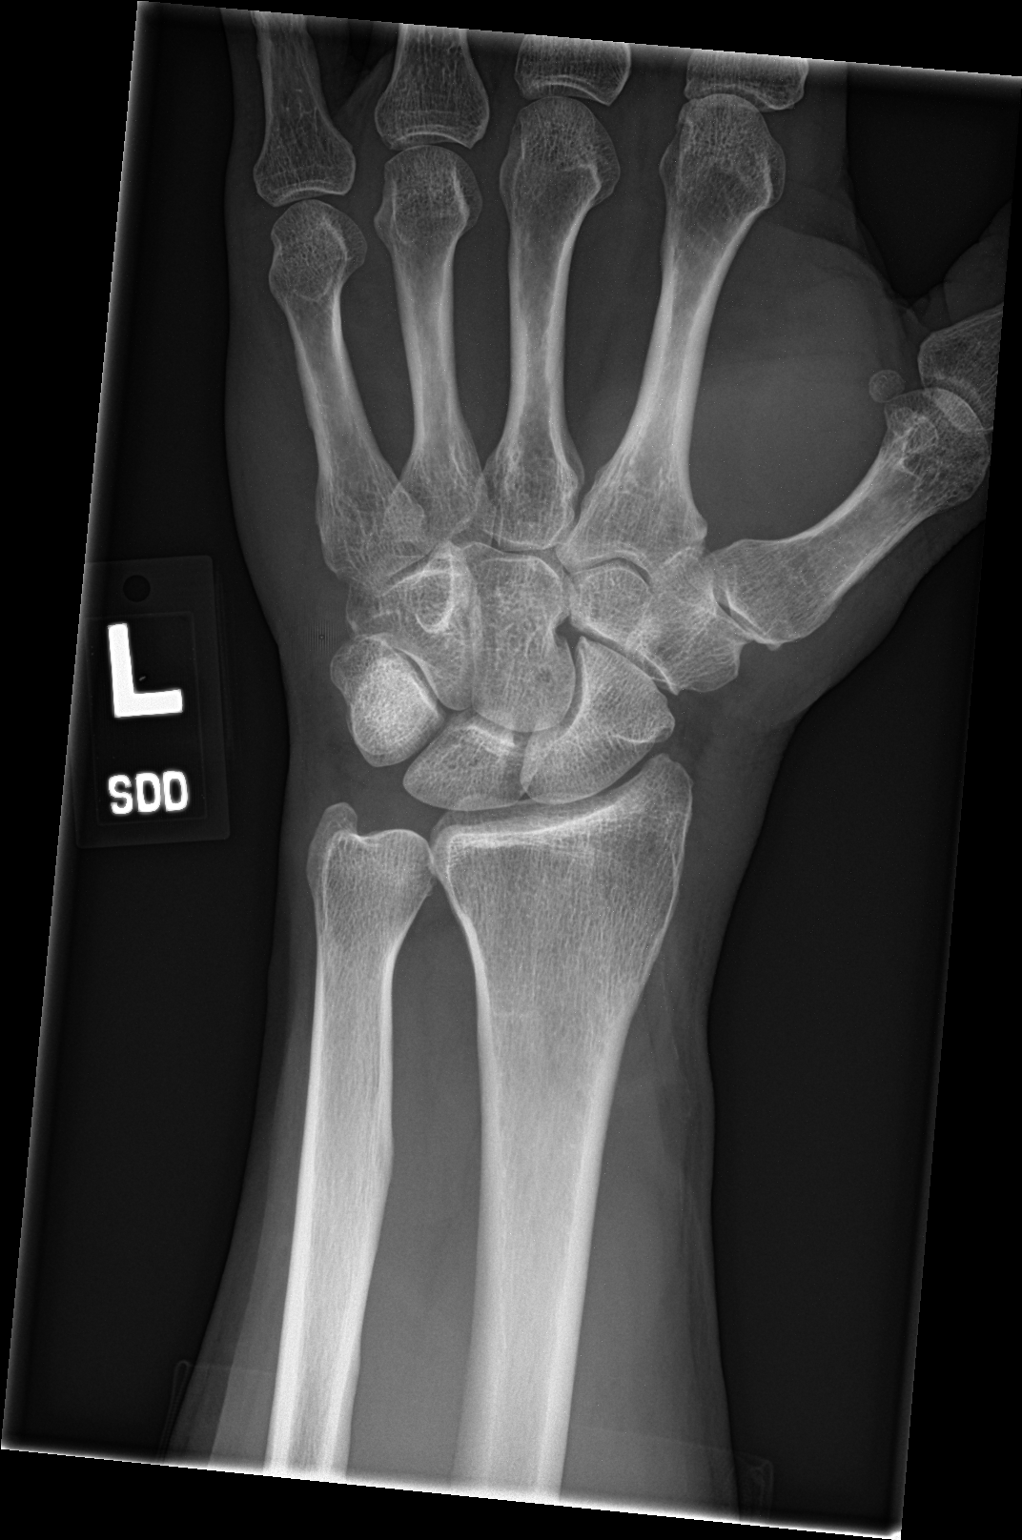

[wrist navicular]
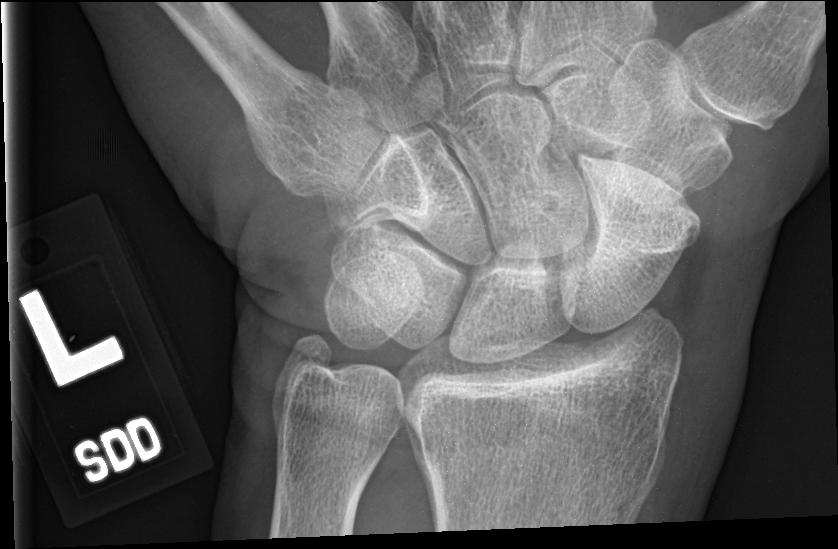

[wrist obl]
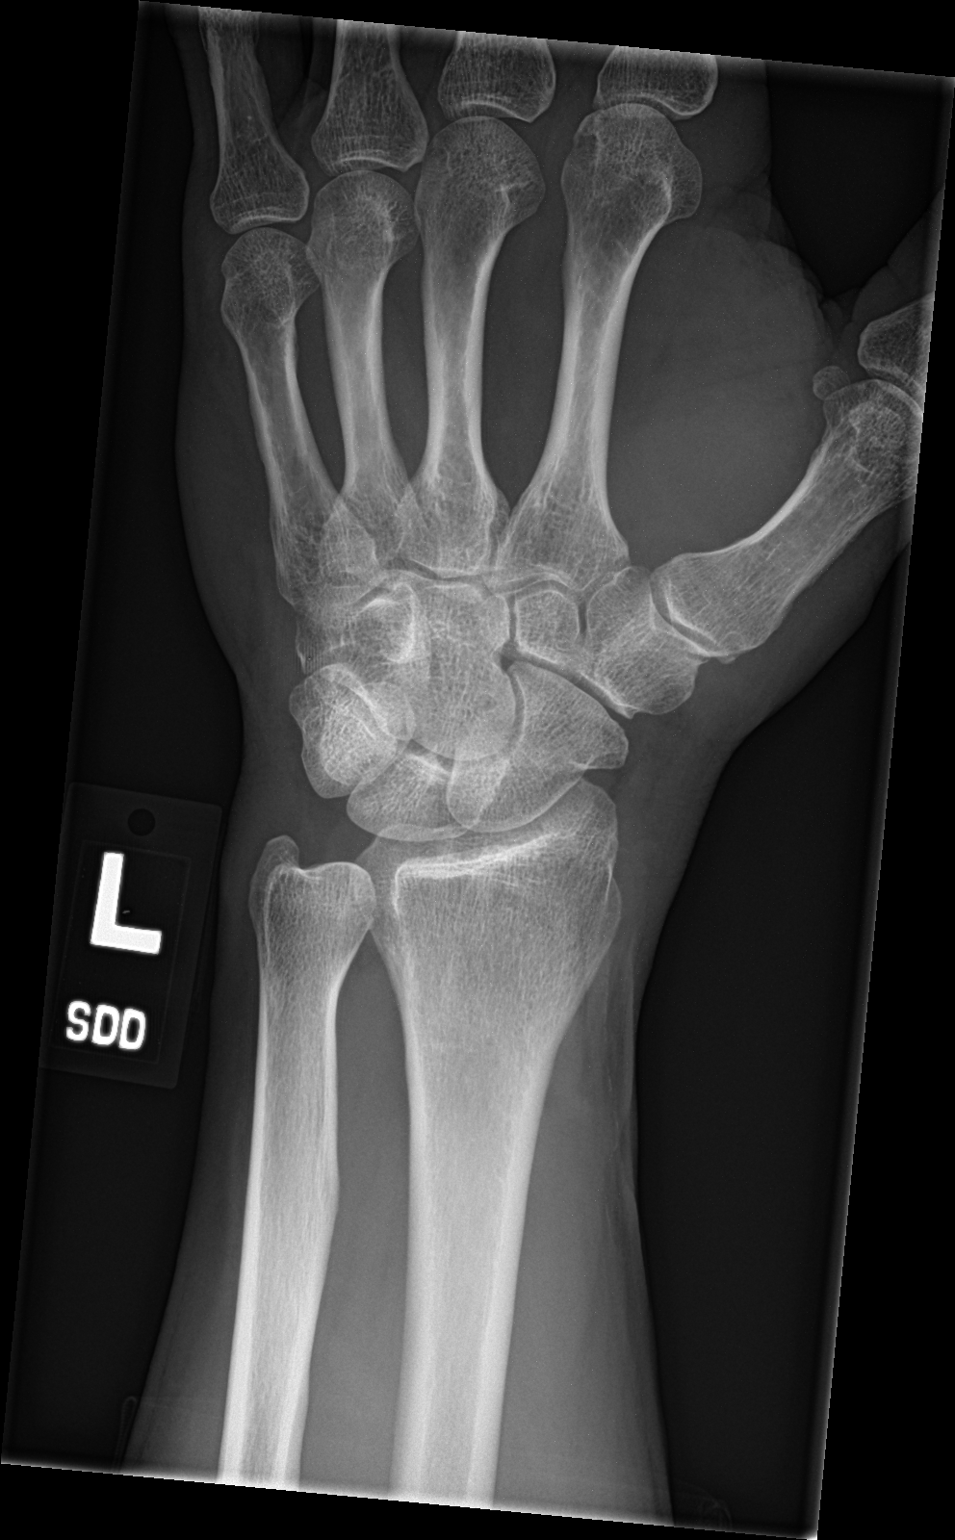

[wrist lat]
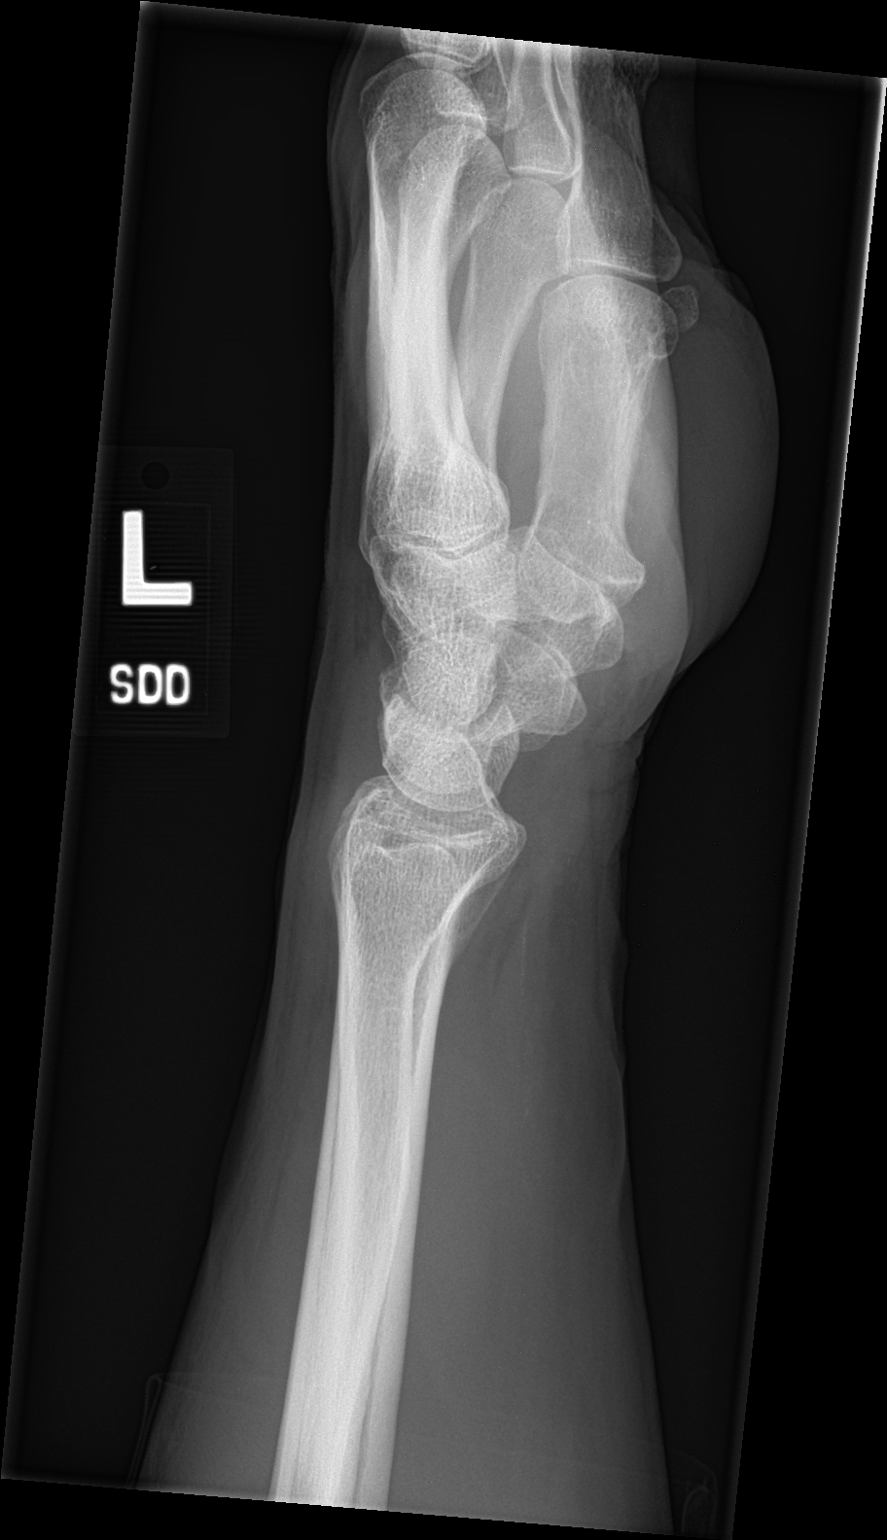

[4 of 4 positions shown; findings below may reference images not displayed]

FINDINGS: There is no evidence of fracture or dislocation. There is no
evidence of arthropathy or other focal bone abnormality. Soft
tissues are unremarkable.
IMPRESSION: Negative evaluation of the left wrist

## 2022-03-01 ENCOUNTER — Encounter: Payer: Self-pay | Admitting: Radiology

## 2022-09-13 ENCOUNTER — Other Ambulatory Visit: Payer: Commercial Managed Care - PPO

## 2022-09-20 ENCOUNTER — Other Ambulatory Visit: Payer: Commercial Managed Care - PPO | Admitting: Urology

## 2022-09-21 ENCOUNTER — Other Ambulatory Visit: Payer: Commercial Managed Care - PPO

## 2022-10-25 ENCOUNTER — Ambulatory Visit (INDEPENDENT_AMBULATORY_CARE_PROVIDER_SITE_OTHER): Payer: Commercial Managed Care - PPO | Admitting: Urology

## 2022-10-25 VITALS — BP 127/76 | HR 58 | Ht 71.0 in | Wt 198.0 lb

## 2022-10-25 DIAGNOSIS — Z8551 Personal history of malignant neoplasm of bladder: Secondary | ICD-10-CM | POA: Diagnosis not present

## 2022-10-25 DIAGNOSIS — N5201 Erectile dysfunction due to arterial insufficiency: Secondary | ICD-10-CM

## 2022-10-25 DIAGNOSIS — R351 Nocturia: Secondary | ICD-10-CM

## 2022-10-25 DIAGNOSIS — Z87442 Personal history of urinary calculi: Secondary | ICD-10-CM

## 2022-10-25 DIAGNOSIS — R3912 Poor urinary stream: Secondary | ICD-10-CM

## 2022-10-25 DIAGNOSIS — N138 Other obstructive and reflux uropathy: Secondary | ICD-10-CM

## 2022-10-25 DIAGNOSIS — N401 Enlarged prostate with lower urinary tract symptoms: Secondary | ICD-10-CM

## 2022-10-25 MED ORDER — CIPROFLOXACIN HCL 500 MG PO TABS
500.0000 mg | ORAL_TABLET | Freq: Once | ORAL | Status: AC
  Administered 2022-10-25: 500 mg via ORAL

## 2022-10-25 MED ORDER — TAMSULOSIN HCL 0.4 MG PO CAPS: 0.4000 mg | ORAL_CAPSULE | Freq: Every day | ORAL | 3 refills | Status: DC

## 2022-10-25 NOTE — Progress Notes (Signed)
Subjective:  1. History of bladder cancer   2. BPH with urinary obstruction   3. Weak urinary stream   4. Nocturia   5. Erectile dysfunction due to arterial insufficiency   6. History of urinary stone    10/25/22: Lee Hughes returns today in f/u for cystoscopy for his history of bladder cancer.  His PSA was 1.5 in 9/23 at last f/u.  He remains on tamsulosin for BPH.  His IPSS is 10 with a reduced stream and frequency with nocturia x 2. He has not had further stones.  He has not tried the sildenafil.    09/21/21: Lee Hughes returns today in f/u for his history of bladder cancer and returns for cystoscopy. He has had no hematuria. He remains on tamsulosin for BPH with BOO.     He has some urgency with mild UUI.  He has a reduced stream.  He has had no hematuria.  He had BCG maintenance with the last treatment on 10/09/19.    UA is clear today.   He had a TURBT of a 3cm tumor on 12/18/16 from the left lateral wall. It was multifocal Ta primarily low grade disease with some foci of high risk features. He completed induction BCG on 02/22/17   He required ESWL for a stone in 4/23.   He has a history of an elevated PSA but the last was 2.1 in 9/22.  I will repeat that today.   He has progressive ED but has not tried the sildenafil.          ROS:  ROS:  A complete review of systems was performed.  All systems are negative except for pertinent findings as noted.   Review of Systems  All other systems reviewed and are negative.   Allergies  Allergen Reactions   No Known Allergies    Other     No beef, pork, deer, products secondary from tick bite    Outpatient Encounter Medications as of 10/25/2022  Medication Sig   Cetirizine HCl 10 MG CAPS Take by mouth 2 (two) times daily.    Cholecalciferol (VITAMIN D-3) 125 MCG (5000 UT) TABS Take 2 tablets by mouth daily.    escitalopram (LEXAPRO) 5 MG tablet TAKE 1 TABLET BY MOUTH DAILY.   Multiple Vitamin (MULTIVITAMIN) capsule Take by mouth daily.    omega-3 acid ethyl esters (LOVAZA) 1 g capsule Take 2 capsules (2 g total) by mouth 2 (two) times daily.   [DISCONTINUED] tamsulosin (FLOMAX) 0.4 MG CAPS capsule TAKE 1 CAPSULE BY MOUTH EVERY DAY   tamsulosin (FLOMAX) 0.4 MG CAPS capsule Take 1 capsule (0.4 mg total) by mouth daily.   [EXPIRED] ciprofloxacin (CIPRO) tablet 500 mg    No facility-administered encounter medications on file as of 10/25/2022.    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   Arthritis    bilateral fingers   Asthma    as child, no longer problematic   Bladder cancer (HCC)    Status post surgery and immunotherapy 2018   Carpal tunnel syndrome on right 12/16/2019   Depression    Fatty liver    Hypercholesteremia    Irritable bowel syndrome    Nephrolithiasis    Prediabetes    Throat swelling    from tick   Tongue swelling    from tick   Urticaria    from tick   Varicella zoster     Past Surgical History:  Procedure  Laterality Date   COLONOSCOPY N/A 09/09/2012   Procedure: COLONOSCOPY;  Surgeon: Dalia Heading, MD;  [facility];  Service: Gastroenterology;  Laterality: N/A;   EXTRACORPOREAL SHOCK WAVE LITHOTRIPSY Left 04/04/2021   Procedure: EXTRACORPOREAL SHOCK WAVE LITHOTRIPSY (ESWL);  Surgeon: Malen Gauze, MD;  Location: [facility];  Service: Urology;  Laterality: Left;  pt being told by office to be NPO and arrive at 10:30 - no PAT   Left femur Left 1988   TRANSURETHRAL RESECTION OF BLADDER TUMOR N/A 12/18/2016   Procedure: CYSTOSCOPY TRANSURETHRAL RESECTION OF BLADDER TUMOR (TURBT);  Surgeon: Bjorn Pippin, MD;  Location: [facility];  Service: Urology;  Laterality: N/A;    Social History   Socioeconomic History   Marital status: Married    Spouse name: Harvin Hazel   Number of children: Not on file   Years of education: Not on file   Highest education level: Associate degree: academic program  Occupational History   Not on  file  Tobacco Use   Smoking status: Never   Smokeless tobacco: Never  Vaping Use   Vaping status: Never Used  Substance and Sexual Activity   Alcohol use: Yes    Comment: occas   Drug use: Never   Sexual activity: Not Currently  Other Topics Concern   Not on file  Social History Narrative   Married 19 years. Lives with wife. 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.   Social Determinants of Health   Financial Resource Strain: Patient Declined (01/12/2022)   Received from [facility], [facility]   Overall Financial Resource Strain (CARDIA)    Difficulty of Paying Living Expenses: Patient declined  Food Insecurity: Patient Declined (01/12/2022)   Received from [facility], [facility]   Hunger Vital Sign    Worried About Running Out of Food in the Last Year: Patient declined    Ran Out of Food in the Last Year: Patient declined  Transportation Needs: Patient Declined (01/12/2022)   Received from [facility], [facility]   [facility] - Transportation    Lack of Transportation (Medical): Patient declined    Lack of Transportation (Non-Medical): Patient declined  Physical Activity: Sufficiently Active (05/26/2021)   Received from [facility]   Exercise Vital Sign    Days of Exercise per Week: 4 days    Minutes of Exercise per Session: 40 min  Stress: No Stress Concern Present (05/26/2021)   Received from [facility], [facility]   [employer] [facility] - Occupational Stress Questionnaire    Feeling of Stress : Not at all  Social Connections: Moderately Integrated (05/26/2021)   Received from [facility], [facility]   Social Connection and Isolation Panel [NHANES]    Frequency of Communication with Friends and Family: More than three times a week    Frequency of Social Gatherings with Friends and Family: More than three times a week    Attends Religious Services: 1 to 4 times per year    Active Member of [employer] or Organizations: No    Attends  Banker Meetings: Never    Marital Status: Married  Catering manager Violence: Not At Risk (05/26/2021)   Received from [employer], [facility]   Humiliation, Afraid, Rape, and Kick questionnaire    Fear of Current or Ex-Partner: No    Emotionally Abused: No    Physically Abused: No    Sexually Abused: No    Family History  Problem Relation Age of Onset  Alcoholism Mother    Cirrhosis Mother    Alcoholism Father    Broken bones Father    Pancreatic cancer Brother    Pancreatic disease Brother    Dementia Paternal Aunt    Dementia Paternal Uncle    Stroke Maternal Grandmother    Cancer Maternal Grandfather    Diabetes Maternal Grandfather    Emphysema Paternal Grandfather    Allergic rhinitis Neg Hx    Angioedema Neg Hx    Asthma Neg Hx    Atopy Neg Hx    Eczema Neg Hx    Immunodeficiency Neg Hx    Urticaria Neg Hx        Objective: Vitals:   10/25/22 1534  BP: 127/76  Pulse: (!) 58      Physical Exam  Lab Results:  Results for orders placed or performed in visit on 10/25/22 (from the past 24 hour(s))  Urinalysis, Routine w reflex microscopic     Status: None   Collection Time: 10/25/22  3:54 PM  Result Value Ref Range   Specific Gravity, UA 1.010 1.005 - 1.030   pH, UA 7.0 5.0 - 7.5   Color, UA Yellow Yellow   Appearance Ur Clear Clear   Leukocytes,UA Negative Negative   Protein,UA Negative Negative/Trace   Glucose, UA Negative Negative   Ketones, UA Negative Negative   RBC, UA Negative Negative   Bilirubin, UA Negative Negative   Urobilinogen, Ur 0.2 0.2 - 1.0 mg/dL   Nitrite, UA Negative Negative   Microscopic Examination Comment    Narrative   Performed at:  [address] - [facility] [address] [place], [place]  [id number] Lab Director: Chinita Pester MT, Phone:  [phone]     BMET No results for input(s): "NA", "K", "CL", "CO2", "GLUCOSE", "BUN", "CREATININE", "CALCIUM" in the last 72 hours. PSA PSA  Date Value  Ref Range Status  03/19/2019 4.1 (H) < OR = 4.0 ng/mL Final    Comment:    The total PSA value from this assay system is  standardized against the WHO standard. The test  result will be approximately 20% lower when compared  to the equimolar-standardized total PSA (Beckman  Coulter). Comparison of serial PSA results should be  interpreted with this fact in mind. . This test was performed using the Siemens  chemiluminescent method. Values obtained from  different assay methods cannot be used interchangeably. PSA levels, regardless of value, should not be interpreted as absolute evidence of the presence or absence of disease.    Prostate Specific Ag, Serum  Date Value Ref Range Status  09/21/2021 1.5 0.0 - 4.0 ng/mL Final    Comment:    Roche ECLIA methodology. According to the American Urological Association, Serum PSA should decrease and remain at undetectable levels after radical prostatectomy. The AUA defines biochemical recurrence as an initial PSA value 0.2 ng/mL or greater followed by a subsequent confirmatory PSA value 0.2 ng/mL or greater. Values obtained with different assay methods or kits cannot be used interchangeably. Results cannot be interpreted as absolute evidence of the presence or absence of malignant disease.   09/29/2020 2.1 0.0 - 4.0 ng/mL Final    Comment:    Roche ECLIA methodology. According to the American Urological Association, Serum PSA should decrease and remain at undetectable levels after radical prostatectomy. The AUA defines biochemical recurrence as an initial PSA value 0.2 ng/mL or greater followed by a subsequent confirmatory PSA value 0.2 ng/mL or greater. Values obtained with different assay methods or  kits cannot be used interchangeably. Results cannot be interpreted as absolute evidence of the presence or absence of malignant disease.     No results found for: "TESTOSTERONE"  Lab Results  Component Value Date   PSA1 1.5  09/21/2021   PSA1 2.1 09/29/2020   PSA1 3.0 12/15/2019    UA is clear.   Studies/Results: Cystoscopy:   He was prepped with betadine, draped with sterile towels and the urethra was instilled with 2% lidocaine.  Cipro 500mg  po given today.   Urethra: normal.  Prostate: 3cm with bilobar hyperplasia with obstruction.  No middle lobe.  Bladder: Mild trabeculation without tumors or stones.   Ureteral orifices: normal.  Complications: none.       Assessment & Plan: 1. History of bladder cancer.   No recurrence noted on cystoscopy.   CT stone study in 4/23 showed nothing to suggest upper tract disease.   He will need repeat cystoscopy in a year.   2. BPH with BOO and mild LUTS but with occasional urgency with minimal UUI. Tamsulosin refilled.    His PSA was back down to 1.5 last year and will be repeated prior to next f/u.     3. History of stones.   KUB in a year.    Meds ordered this encounter  Medications   ciprofloxacin (CIPRO) tablet 500 mg   tamsulosin (FLOMAX) 0.4 MG CAPS capsule    Sig: Take 1 capsule (0.4 mg total) by mouth daily.    Dispense:  90 capsule    Refill:  3     Orders Placed This Encounter  Procedures   DG Abd 1 View    Standing Status:   Future    Standing Expiration Date:   10/25/2023    Order Specific Question:   Reason for Exam (SYMPTOM  OR DIAGNOSIS REQUIRED)    Answer:   history of stones    Order Specific Question:   Preferred imaging location?    Answer:   [facility]    Order Specific Question:   Radiology Contrast Protocol - do NOT remove file path    Answer:   [email or web address]   Urinalysis, Routine w reflex microscopic   PSA, total and free    Standing Status:   Future    Standing Expiration Date:   10/25/2023   Cystoscopy      Return in about 1 year (around 10/25/2023) for with PSA for cystoscopy..   CC: Dr. Norberto Sorenson MD    Bjorn Pippin 10/26/2022 Patient ID: Lorrene Reid, male   DOB: [birth year], 61 y.o.   MRN: [id number]

## 2022-10-26 LAB — URINALYSIS, ROUTINE W REFLEX MICROSCOPIC
Bilirubin, UA: NEGATIVE
Glucose, UA: NEGATIVE
Ketones, UA: NEGATIVE
Leukocytes,UA: NEGATIVE
Nitrite, UA: NEGATIVE
Protein,UA: NEGATIVE
RBC, UA: NEGATIVE
Specific Gravity, UA: 1.01 (ref 1.005–1.030)
Urobilinogen, Ur: 0.2 mg/dL (ref 0.2–1.0)
pH, UA: 7 (ref 5.0–7.5)

## 2023-01-21 ENCOUNTER — Telehealth: Payer: Self-pay | Admitting: *Deleted

## 2023-01-21 DIAGNOSIS — Z1211 Encounter for screening for malignant neoplasm of colon: Secondary | ICD-10-CM

## 2023-01-21 MED ORDER — SUTAB 1479-225-188 MG PO TABS: ORAL_TABLET | ORAL | 0 refills | Status: AC

## 2023-01-21 NOTE — Telephone Encounter (Signed)
Patient due for colon cancer screening.   Requested Surgical Date: 02/12/2023 Procedure: Colonoscopy W/ Propofol   Sutab ordered for bowel prep.

## 2023-02-11 NOTE — H&P (Signed)
 Lee Hughes is an 63 y.o. male.   Chief Complaint: Need for screening colonoscopy HPI: Patient is a 63 year old white male who was referred to my care for a screening colonoscopy.  He last had a colonoscopy 10 years ago.  He denies any blood per rectum, abnormal constipation or diarrhea, family history of colon cancer, or abdominal pain.  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   Arthritis    bilateral fingers   Asthma    as child, no longer problematic   Bladder cancer (HCC)    Status post surgery and immunotherapy 2018   Carpal tunnel syndrome on right 12/16/2019   Depression    Fatty liver    Hypercholesteremia    Irritable bowel syndrome    Nephrolithiasis    Prediabetes    Throat swelling    from tick   Tongue swelling    from tick   Urticaria    from tick   Varicella zoster     Past Surgical History:  Procedure Laterality Date   COLONOSCOPY N/A 09/09/2012   Procedure: COLONOSCOPY;  Surgeon: Lee Bound, MD;  [facility];  Service: Gastroenterology;  Laterality: N/A;   EXTRACORPOREAL SHOCK WAVE LITHOTRIPSY Left 04/04/2021   Procedure: EXTRACORPOREAL SHOCK WAVE LITHOTRIPSY (ESWL);  Surgeon: Lee Severs, MD;  Location: [facility];  Service: Urology;  Laterality: Left;  pt being told by office to be NPO and arrive at 10:30 - no PAT   Left femur Left 1988   TRANSURETHRAL RESECTION OF BLADDER TUMOR N/A 12/18/2016   Procedure: CYSTOSCOPY TRANSURETHRAL RESECTION OF BLADDER TUMOR (TURBT);  Surgeon: Lee Luster, MD;  Location[facility];  Service: Urology;  Laterality: N/A;    Family History  Problem Relation Age of Onset   Alcoholism Mother    Cirrhosis Mother    Alcoholism Father    Broken bones Father    Pancreatic cancer Brother    Pancreatic disease Brother    Dementia Paternal Aunt    Dementia Paternal Uncle    Stroke Maternal Grandmother    Cancer Maternal Grandfather    Diabetes  Maternal Grandfather    Emphysema Paternal Grandfather    Allergic rhinitis Neg Hx    Angioedema Neg Hx    Asthma Neg Hx    Atopy Neg Hx    Eczema Neg Hx    Immunodeficiency Neg Hx    Urticaria Neg Hx    Social History:  reports that he has never smoked. He has never used smokeless tobacco. He reports current alcohol use. He reports that he does not use drugs.  Allergies:  Allergies  Allergen Reactions   No Known Allergies    Other     No beef, pork, deer, products secondary from tick bite    No medications prior to admission.    No results found for this or any previous visit (from the past 48 hours). No results found.  Review of Systems  Constitutional: Negative.   HENT: Negative.    Eyes: Negative.   Respiratory: Negative.    Cardiovascular: Negative.   Gastrointestinal: Negative.   Endocrine: Negative.   Genitourinary: Negative.   Musculoskeletal: Negative.   Skin: Negative.   Allergic/Immunologic: Negative.   Neurological: Negative.   Hematological: Negative.   Psychiatric/Behavioral: Negative.      There were no vitals taken for this visit. Physical Exam Vitals reviewed.  Constitutional:  Appearance: Normal appearance. He is not ill-appearing.  HENT:     Head: Normocephalic and atraumatic.  Cardiovascular:     Rate and Rhythm: Normal rate and regular rhythm.     Heart sounds: Normal heart sounds. No murmur heard.    No friction rub. No gallop.  Pulmonary:     Effort: Pulmonary effort is normal. No respiratory distress.     Breath sounds: Normal breath sounds. No stridor. No wheezing, rhonchi or rales.  Abdominal:     General: Bowel sounds are normal. There is no distension.     Palpations: Abdomen is soft. There is no mass.     Tenderness: There is no abdominal tenderness. There is no guarding or rebound.     Hernia: No hernia is present.  Skin:    General: Skin is warm and dry.  Neurological:     Mental Status: He is alert and oriented to  person, place, and time.      Assessment/Plan Impression: Need for screening colonoscopy  Plan: Patient is scheduled for screening colonoscopy on 02/12/2023.  The risks and benefits of the procedure including bleeding and perforation were fully explained to the patient, who gave informed consent.  Sutabs have been prescribed preoperatively for bowel preparation. Alanda Allegra, MD 02/11/2023, 2:12 PM

## 2023-02-12 ENCOUNTER — Encounter (HOSPITAL_COMMUNITY): Payer: Self-pay | Admitting: General Surgery

## 2023-02-12 ENCOUNTER — Other Ambulatory Visit: Payer: Self-pay

## 2023-02-12 ENCOUNTER — Encounter (HOSPITAL_COMMUNITY)
Admission: RE | Disposition: A | Discharge status: Home / Self Care | Payer: Self-pay | Source: Home / Self Care | Attending: General Surgery

## 2023-02-12 ENCOUNTER — Ambulatory Visit (HOSPITAL_BASED_OUTPATIENT_CLINIC_OR_DEPARTMENT_OTHER): Payer: Commercial Managed Care - PPO | Admitting: Anesthesiology

## 2023-02-12 ENCOUNTER — Ambulatory Visit (HOSPITAL_COMMUNITY)
Admission: RE | Admit: 2023-02-12 | Discharge: 2023-02-12 | Disposition: A | Discharge status: Home / Self Care | Payer: Commercial Managed Care - PPO | Attending: General Surgery | Admitting: General Surgery

## 2023-02-12 ENCOUNTER — Ambulatory Visit (HOSPITAL_COMMUNITY): Payer: Commercial Managed Care - PPO | Admitting: Anesthesiology

## 2023-02-12 DIAGNOSIS — Z1211 Encounter for screening for malignant neoplasm of colon: Secondary | ICD-10-CM

## 2023-02-12 DIAGNOSIS — F419 Anxiety disorder, unspecified: Secondary | ICD-10-CM | POA: Insufficient documentation

## 2023-02-12 DIAGNOSIS — K760 Fatty (change of) liver, not elsewhere classified: Secondary | ICD-10-CM | POA: Diagnosis not present

## 2023-02-12 DIAGNOSIS — E7800 Pure hypercholesterolemia, unspecified: Secondary | ICD-10-CM | POA: Insufficient documentation

## 2023-02-12 DIAGNOSIS — J45909 Unspecified asthma, uncomplicated: Secondary | ICD-10-CM | POA: Insufficient documentation

## 2023-02-12 DIAGNOSIS — F32A Depression, unspecified: Secondary | ICD-10-CM | POA: Diagnosis not present

## 2023-02-12 HISTORY — PX: COLONOSCOPY WITH PROPOFOL: SHX5780

## 2023-02-12 SURGERY — COLONOSCOPY WITH PROPOFOL
Anesthesia: General

## 2023-02-12 MED ORDER — PROPOFOL 10 MG/ML IV BOLUS
INTRAVENOUS | Status: DC | PRN
  Administered 2023-02-12: 150 mg via INTRAVENOUS
  Administered 2023-02-12: 100 mg via INTRAVENOUS

## 2023-02-12 MED ORDER — MEPERIDINE HCL 50 MG/ML IJ SOLN: 6.2500 mg | INTRAMUSCULAR | Status: DC | PRN

## 2023-02-12 MED ORDER — LACTATED RINGERS IV SOLN: INTRAVENOUS | Status: DC

## 2023-02-12 MED ORDER — ONDANSETRON HCL 4 MG/2ML IJ SOLN: 4.0000 mg | Freq: Once | INTRAMUSCULAR | Status: DC | PRN

## 2023-02-12 MED ORDER — FENTANYL CITRATE (PF) 100 MCG/2ML IJ SOLN: 25.0000 ug | INTRAMUSCULAR | Status: DC | PRN

## 2023-02-12 MED ORDER — METOCLOPRAMIDE HCL 5 MG/ML IJ SOLN: 10.0000 mg | Freq: Once | INTRAMUSCULAR | Status: DC | PRN

## 2023-02-12 NOTE — Anesthesia Postprocedure Evaluation (Signed)
Anesthesia Post Note  Patient: Lee Hughes  Procedure(s) Performed: COLONOSCOPY WITH PROPOFOL  Patient location during evaluation: PACU Anesthesia Type: General Level of consciousness: awake and alert Pain management: pain level controlled Vital Signs Assessment: post-procedure vital signs reviewed and stable Respiratory status: spontaneous breathing, nonlabored ventilation, respiratory function stable and patient connected to nasal cannula oxygen Cardiovascular status: blood pressure returned to baseline and stable Postop Assessment: no apparent nausea or vomiting Anesthetic complications: no   No notable events documented.   Last Vitals:  Vitals:   02/12/23 0649 02/12/23 0809  BP: (!) 148/68 (!) 110/58  Pulse: (!) 53 68  Resp: 18 18  Temp: 36.6 C 36.9 C  SpO2: 98% 97%    Last Pain:  Vitals:   02/12/23 0813  TempSrc:   PainSc: 0-No pain                 Roslynn Amble

## 2023-02-12 NOTE — Interval H&P Note (Signed)
History and Physical Interval Note:  02/12/2023 7:26 AM  Lee Hughes  has presented today for surgery, with the diagnosis of COLON CANCER SCREENING.  The various methods of treatment have been discussed with the patient and family. After consideration of risks, benefits and other options for treatment, the patient has consented to  Procedure(s): COLONOSCOPY WITH PROPOFOL (N/A) as a surgical intervention.  The patient's history has been reviewed, patient examined, no change in status, stable for surgery.  I have reviewed the patient's chart and labs.  Questions were answered to the patient's satisfaction.     Franky Macho

## 2023-02-12 NOTE — Anesthesia Preprocedure Evaluation (Addendum)
Anesthesia Evaluation  Patient identified by MRN, date of birth, ID band Patient awake    Reviewed: Allergy & Precautions, H&P , NPO status , Patient's Chart, lab work & pertinent test results, reviewed documented beta blocker date and time   Airway Mallampati: II  TM Distance: >3 FB Neck ROM: full    Dental no notable dental hx.    Pulmonary asthma    Pulmonary exam normal breath sounds clear to auscultation       Cardiovascular Exercise Tolerance: Good negative cardio ROS  Rhythm:regular Rate:Normal     Neuro/Psych   Anxiety Depression     Neuromuscular disease  negative psych ROS   GI/Hepatic negative GI ROS, Neg liver ROS,,,  Endo/Other  negative endocrine ROS    Renal/GU Renal disease  negative genitourinary   Musculoskeletal   Abdominal   Peds  Hematology negative hematology ROS (+)   Anesthesia Other Findings   Reproductive/Obstetrics negative OB ROS                             Anesthesia Physical Anesthesia Plan  ASA: 2  Anesthesia Plan: General   Post-op Pain Management: Minimal or no pain anticipated   Induction: Intravenous  PONV Risk Score and Plan:   Airway Management Planned: Simple Face Mask  Additional Equipment:   Intra-op Plan:   Post-operative Plan:   Informed Consent: I have reviewed the patients History and Physical, chart, labs and discussed the procedure including the risks, benefits and alternatives for the proposed anesthesia with the patient or authorized representative who has indicated his/her understanding and acceptance.     Dental Advisory Given  Plan Discussed with: CRNA  Anesthesia Plan Comments:        Anesthesia Quick Evaluation

## 2023-02-12 NOTE — Op Note (Signed)
[facility] Patient Name: Lee Hughes Procedure Date: 02/12/2023 7:07 AM MRN: [id number] Date of Birth: 01-24-1960 Attending MD: Franky Macho , MD, [id number] CSN: [id number] Age: 63 Admit Type: Outpatient Procedure:                Colonoscopy Indications:              Screening for colorectal malignant neoplasm Providers:                Franky Macho, MD, Crystal Page, Cyril Mourning, Technician Referring MD:              Medicines:                Propofol per Anesthesia Complications:            No immediate complications. Estimated Blood Loss:     Estimated blood loss: none. Procedure:                Pre-Anesthesia Assessment:                           - Prior to the procedure, a History and Physical                            was performed, and patient medications and                            allergies were reviewed. The patient is competent.                            The risks and benefits of the procedure and the                            sedation options and risks were discussed with the                            patient. All questions were answered and informed                            consent was obtained. Patient identification and                            proposed procedure were verified by the physician,                            the nurse, the anesthesiologist, the anesthetist                            and the technician in the procedure room. Mental                            Status Examination: alert and oriented. Airway  Examination: normal oropharyngeal airway and neck                            mobility. Respiratory Examination: clear to                            auscultation. CV Examination: RRR, no murmurs, no                            S3 or S4. Prophylactic Antibiotics: The patient                            does not require prophylactic antibiotics. Prior                             Anticoagulants: The patient has taken no                            anticoagulant or antiplatelet agents. ASA Grade                            Assessment: II - A patient with mild systemic                            disease. After reviewing the risks and benefits,                            the patient was deemed in satisfactory condition to                            undergo the procedure. The anesthesia plan was to                            use deep sedation / analgesia. Immediately prior to                            administration of medications, the patient was                            re-assessed for adequacy to receive sedatives. The                            heart rate, respiratory rate, oxygen saturations,                            blood pressure, adequacy of pulmonary ventilation,                            and response to care were monitored throughout the                            procedure. The physical status of the patient was  re-assessed after the procedure.                           After obtaining informed consent, the colonoscope                            was passed under direct vision. Throughout the                            procedure, the patient's blood pressure, pulse, and                            oxygen saturations were monitored continuously. The                            [id number]) scope was introduced through the                            anus and advanced to the the cecum, identified by                            the appendiceal orifice, ileocecal valve and                            palpation. No anatomical landmarks were                            photographed. The colonoscopy was performed without                            difficulty. The patient tolerated the procedure                            well. The quality of the bowel preparation was                            adequate. The total duration of the  procedure was 6                            minutes. Scope In: 7:58:13 AM Scope Out: 8:04:35 AM Scope Withdrawal Time: 0 hours 3 minutes 15 seconds  Total Procedure Duration: 0 hours 6 minutes 22 seconds  Findings:      The perianal and digital rectal examinations were normal.      The entire examined colon appeared normal on direct and retroflexion       views. Impression:               - The entire examined colon is normal on direct and                            retroflexion views.                           - No specimens collected. Moderate Sedation:      Moderate (conscious) sedation was administered by the nurse  and       supervised by the endoscopist. The patient's oxygen saturation, heart       rate, blood pressure and response to care were monitored. Recommendation:           - Written discharge instructions were provided to                            the patient.                           - The signs and symptoms of potential delayed                            complications were discussed with the patient.                           - Patient has a contact number available for                            emergencies.                           - Return to normal activities tomorrow.                           - Resume previous diet.                           - Continue present medications.                           - Repeat colonoscopy in 10 years for screening                            purposes. Procedure Code(s):        --- Professional ---                           [phone], Colonoscopy, flexible; diagnostic, including                            collection of specimen(s) by brushing or washing,                            when performed (separate procedure) Diagnosis Code(s):        --- Professional ---                           Z12.11, Encounter for screening for malignant                            neoplasm of colon CPT copyright 2022 [employer]. All rights  reserved. The codes documented in this report are preliminary and upon coder review may  be revised to meet current compliance requirements. Franky Macho, MD Franky Macho, MD 02/12/2023 8:09:36 AM This report has been signed electronically. Number of Addenda: 0

## 2023-02-12 NOTE — Anesthesia Procedure Notes (Signed)
Date/Time: 02/12/2023 7:52 AM  Performed by: Franco Nones, CRNAPre-anesthesia Checklist: Patient identified, Emergency Drugs available, Suction available, Timeout performed and Patient being monitored Patient Re-evaluated:Patient Re-evaluated prior to induction Oxygen Delivery Method: Nasal cannula Comments: Optiflow

## 2023-02-12 NOTE — Transfer of Care (Signed)
Immediate Anesthesia Transfer of Care Note  Patient: Lee Hughes  Procedure(s) Performed: COLONOSCOPY WITH PROPOFOL  Patient Location: Endoscopy Unit  Anesthesia Type:General  Level of Consciousness: drowsy  Airway & Oxygen Therapy: Patient Spontanous Breathing  Post-op Assessment: Report given to RN and Post -op Vital signs reviewed and stable  Post vital signs: Reviewed and stable  Last Vitals:  Vitals Value Taken Time  BP 110/58 02/12/23 0809  Temp 36.9 C 02/12/23 0809  Pulse 68 02/12/23 0809  Resp 18 02/12/23 0809  SpO2 97 % 02/12/23 0809    Last Pain:  Vitals:   02/12/23 0809  TempSrc: Axillary  PainSc:       Patients Stated Pain Goal: 6 (02/12/23 0640)  Complications: No notable events documented.

## 2023-02-13 ENCOUNTER — Encounter (HOSPITAL_COMMUNITY): Payer: Self-pay | Admitting: General Surgery

## 2023-08-27 ENCOUNTER — Other Ambulatory Visit (HOSPITAL_COMMUNITY): Payer: Self-pay

## 2023-09-18 IMAGING — DX DG HAND 2V*L*
2 series · 2 of 2 positions shown · non-contrast
Comparison: None.

CLINICAL DATA: Patient with injury to the and with Rantona Bhebhe.

EXAM:
LEFT HAND - 2 VIEW

[hand ap]
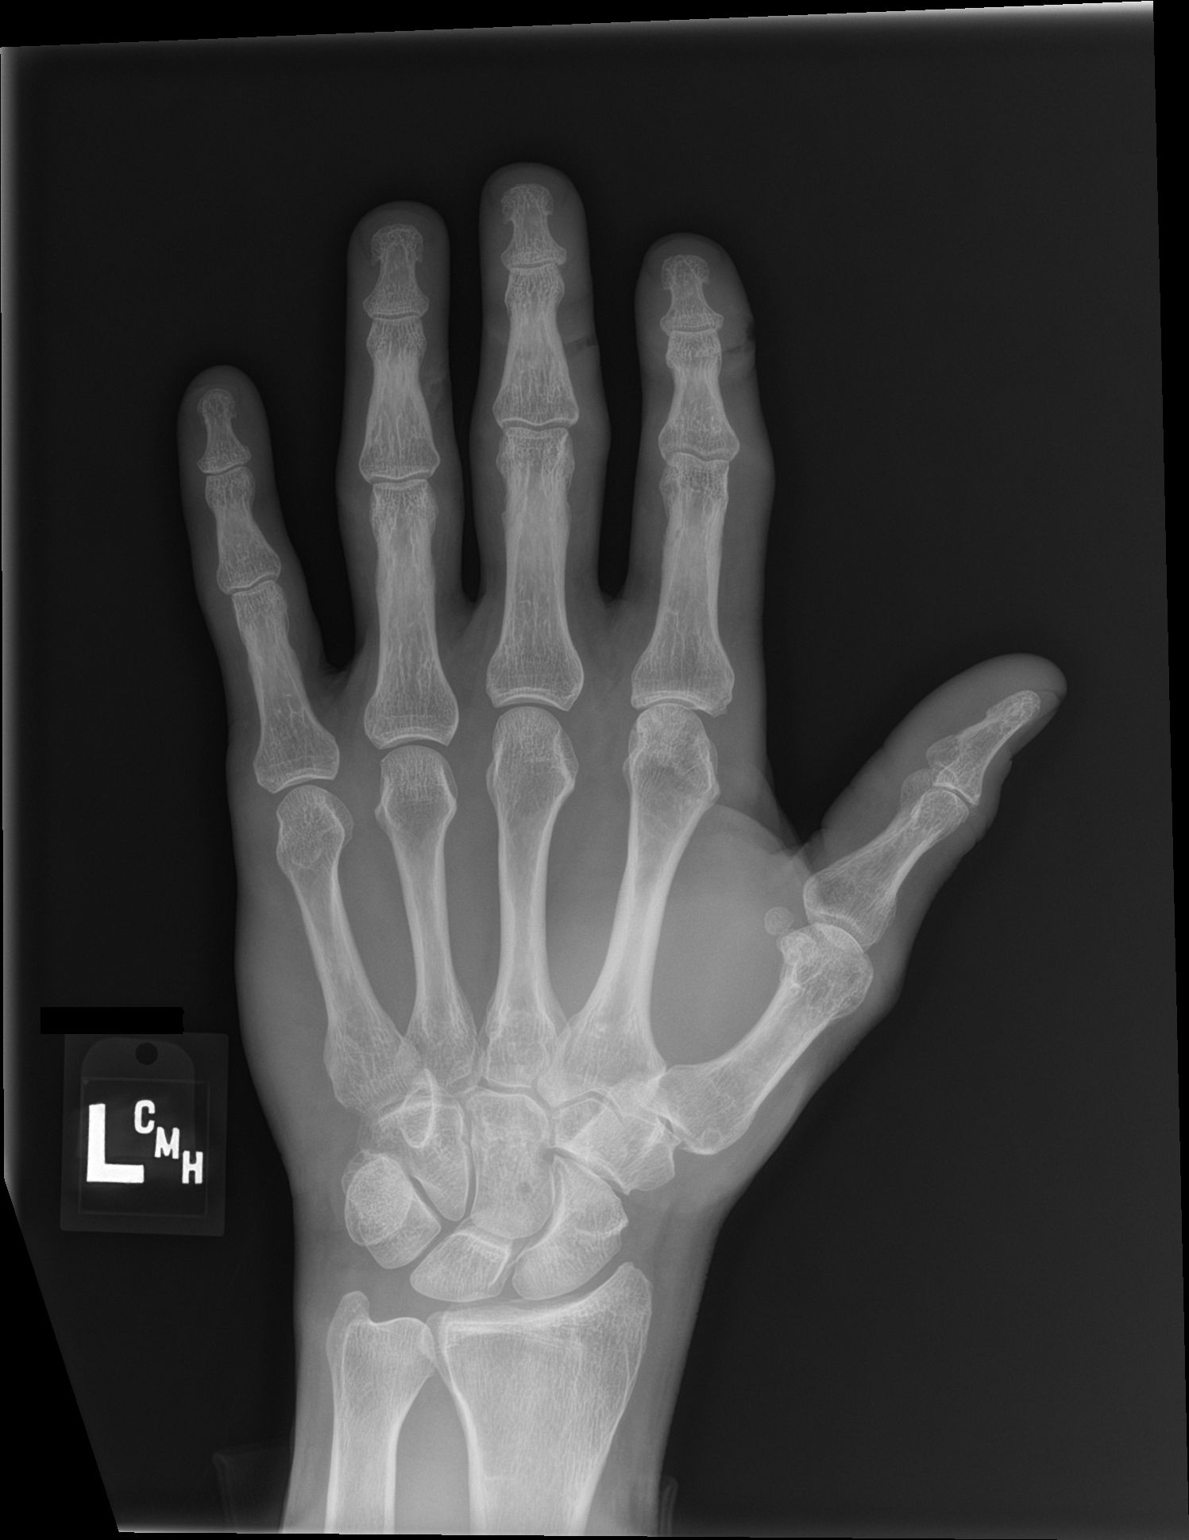

[hand lat]
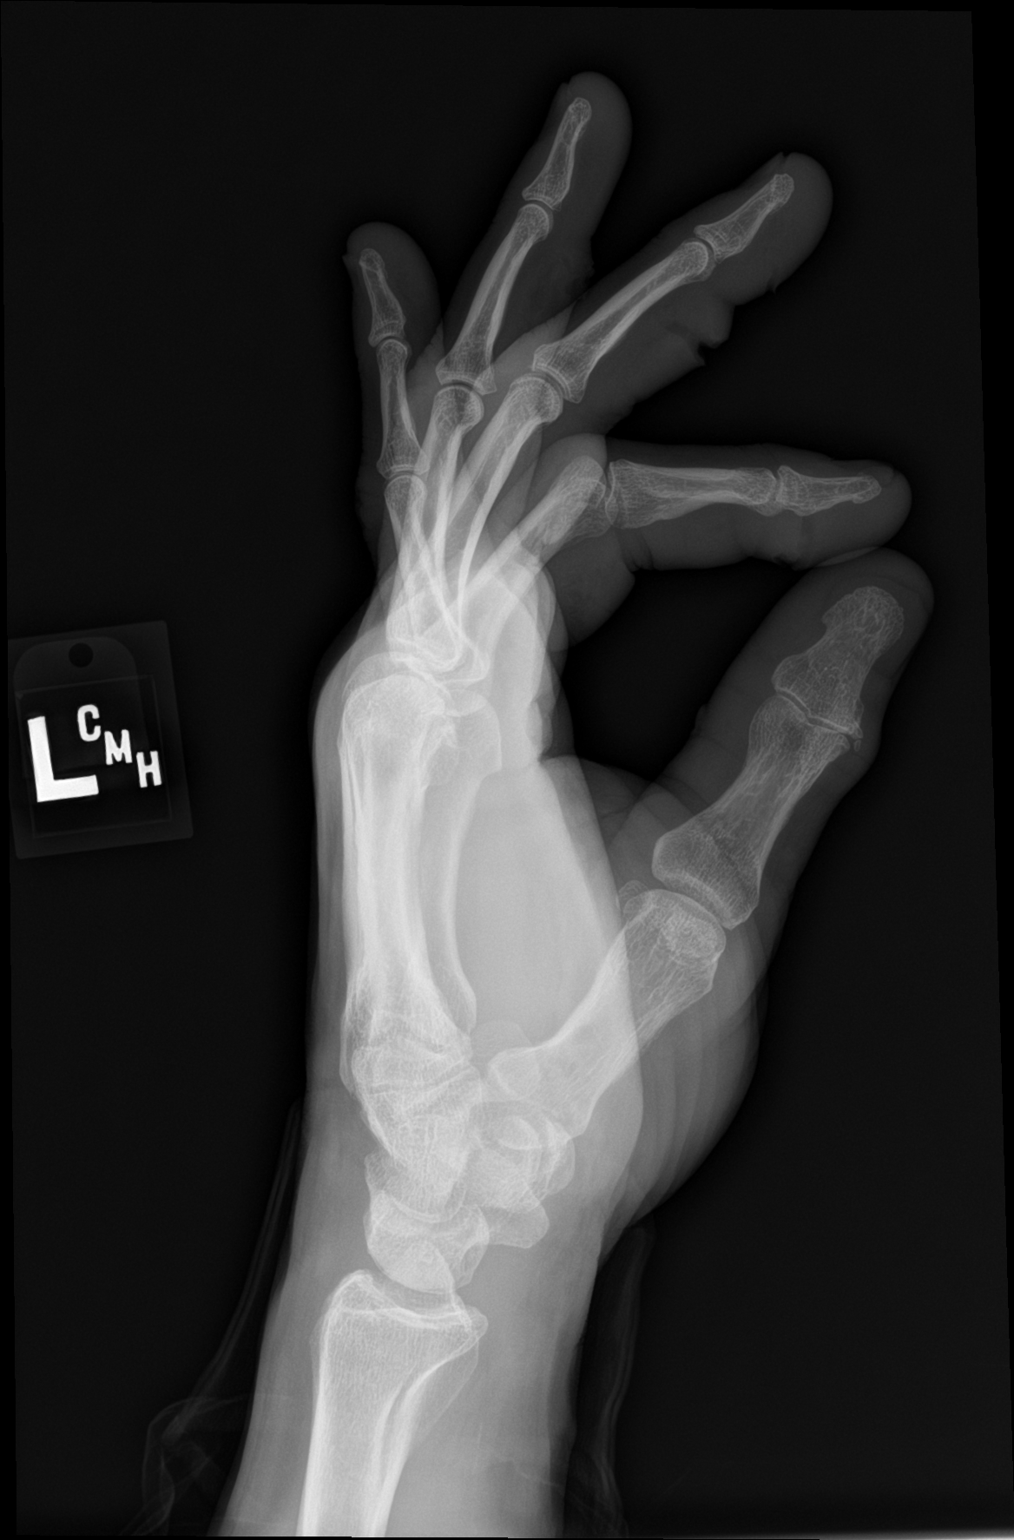

[2 of 2 positions shown; findings below may reference images not displayed]

FINDINGS: Soft tissue laceration along the palmar aspect of the index and
middle fingers. Additionally there is soft tissue injury along the
palmar aspect of the ring finger. No radiopaque foreign body. No
acute osseous abnormality.
IMPRESSION: Soft tissue laceration along the palmar aspect of the second, third
and fourth digits. No acute osseous abnormality.

## 2023-10-17 ENCOUNTER — Other Ambulatory Visit: Payer: Commercial Managed Care - PPO

## 2023-10-24 ENCOUNTER — Other Ambulatory Visit: Payer: Commercial Managed Care - PPO | Admitting: Urology

## 2023-11-01 ENCOUNTER — Other Ambulatory Visit: Payer: Self-pay

## 2023-11-01 ENCOUNTER — Other Ambulatory Visit

## 2023-11-01 DIAGNOSIS — Z8551 Personal history of malignant neoplasm of bladder: Secondary | ICD-10-CM

## 2023-11-02 LAB — PSA, TOTAL AND FREE
PSA, Free Pct: 32.3 %
PSA, Free: 0.42 ng/mL
Prostate Specific Ag, Serum: 1.3 ng/mL (ref 0.0–4.0)

## 2023-11-05 ENCOUNTER — Ambulatory Visit: Payer: Self-pay | Admitting: Urology

## 2023-11-11 ENCOUNTER — Ambulatory Visit: Admitting: Urology

## 2023-11-11 VITALS — BP 139/73 | HR 76

## 2023-11-11 DIAGNOSIS — Z8551 Personal history of malignant neoplasm of bladder: Secondary | ICD-10-CM | POA: Diagnosis not present

## 2023-11-11 DIAGNOSIS — Z08 Encounter for follow-up examination after completed treatment for malignant neoplasm: Secondary | ICD-10-CM | POA: Diagnosis not present

## 2023-11-11 DIAGNOSIS — N138 Other obstructive and reflux uropathy: Secondary | ICD-10-CM

## 2023-11-11 MED ORDER — CIPROFLOXACIN HCL 500 MG PO TABS
500.0000 mg | ORAL_TABLET | Freq: Once | ORAL | Status: AC
  Administered 2023-11-11: 500 mg via ORAL

## 2023-11-11 NOTE — Progress Notes (Unsigned)
   11/11/23  CC: followup bladder cancer   HPI: Mr Lee Hughes is a 63yo here for followup for bladder cancer Blood pressure 139/73, pulse 76. NED. A&Ox3.   No respiratory distress   Abd soft, NT, ND Normal phallus with bilateral descended testicles  Cystoscopy Procedure Note  Patient identification was confirmed, informed consent was obtained, and patient was prepped using Betadine  solution.  Lidocaine  jelly was administered per urethral meatus.     Pre-Procedure: - Inspection reveals a normal caliber ureteral meatus.  Procedure: The flexible cystoscope was introduced without difficulty - No urethral strictures/lesions are present. - Enlarged prostate  - Normal bladder neck - Bilateral ureteral orifices identified - Bladder mucosa  reveals no ulcers, tumors, or lesions - No bladder stones - No trabeculation     Post-Procedure: - Patient tolerated the procedure well  Assessment/ Plan: Followup 1 year with PSA and for cystoscopy  No follow-ups on file.  Belvie Clara, MD

## 2023-11-12 ENCOUNTER — Encounter: Payer: Self-pay | Admitting: Urology

## 2023-11-12 LAB — URINALYSIS, ROUTINE W REFLEX MICROSCOPIC
Bilirubin, UA: NEGATIVE
Glucose, UA: NEGATIVE
Ketones, UA: NEGATIVE
Leukocytes,UA: NEGATIVE
Nitrite, UA: NEGATIVE
Protein,UA: NEGATIVE
RBC, UA: NEGATIVE
Specific Gravity, UA: 1.03 (ref 1.005–1.030)
Urobilinogen, Ur: 0.2 mg/dL (ref 0.2–1.0)
pH, UA: 5.5 (ref 5.0–7.5)

## 2023-11-12 NOTE — Patient Instructions (Signed)

## 2023-11-30 ENCOUNTER — Other Ambulatory Visit: Payer: Self-pay | Admitting: Urology

## 2023-12-11 ENCOUNTER — Other Ambulatory Visit: Payer: Self-pay

## 2023-12-11 MED ORDER — TAMSULOSIN HCL 0.4 MG PO CAPS: 0.4000 mg | ORAL_CAPSULE | Freq: Every day | ORAL | 3 refills | Status: AC

## 2024-01-30 ENCOUNTER — Ambulatory Visit: Admitting: Allergy

## 2024-01-30 ENCOUNTER — Other Ambulatory Visit: Payer: Self-pay

## 2024-01-30 VITALS — BP 130/86 | HR 64 | Temp 97.6°F | Resp 18 | Ht 70.5 in | Wt 205.0 lb

## 2024-01-30 DIAGNOSIS — L501 Idiopathic urticaria: Secondary | ICD-10-CM

## 2024-01-30 DIAGNOSIS — J3089 Other allergic rhinitis: Secondary | ICD-10-CM | POA: Diagnosis not present

## 2024-01-30 DIAGNOSIS — Z91018 Allergy to other foods: Secondary | ICD-10-CM | POA: Diagnosis not present

## 2024-01-30 MED ORDER — FAMOTIDINE 20 MG PO TABS: 20.0000 mg | ORAL_TABLET | Freq: Two times a day (BID) | ORAL | 2 refills | Status: AC

## 2024-01-30 NOTE — Progress Notes (Signed)
 "  New Patient Note  RE: Lee Hughes MRN: [id number] DOB: 1960-02-14 Date of Office Visit: 01/30/2024  Consult requested by: Lee Fairy HERO, NP Primary care provider: Elnor Fairy HERO, NP  Chief Complaint: Establish Care (Previous patient x 8 years alpha gal on zyrtec recent new hives, swelling and itching no change in diet or lifestyle.  per pcp tried and failed Claritin and allegra )  History of Present Illness: I had the pleasure of seeing Lee Hughes for initial evaluation at the [facility] on 01/30/2024. He is a 64 y.o. male, who is referred here by Lee Fairy HERO, NP for the evaluation of rash. Last office visit with Dr. Jeneal in 2019 for hives, allergic rhinitis.   Discussed the use of AI scribe software for clinical note transcription with the patient, who gave verbal consent to proceed.    He has a history of alpha-gal allergy diagnosed in 2017, confirmed by positive blood work. Initially, he experienced severe reactions including swelling, redness, and severe itching. Over the years, he occasionally experienced splotching that resolved within 30 minutes. Recently, there has been an increase in frequency of these splotches, primarily on his face, occurring several times a week. The splotches are flat, non-itchy, and last about 15-20 minutes.  In the past couple of months, these symptoms have become more frequent, sometimes daily. He has not identified any new dietary changes, lotions, or illnesses that correlate with the onset of these symptoms. He recalls having a cold about six weeks ago, for which he took generic cetirizine (Zyrtec) and later tried Claritin and Allegra without significant improvement. He is currently taking cetirizine 24-hour twice daily, but continues to experience symptoms.  Previous allergy testing was positive for cats, dogs, trees and grass. He has a cat at home but reports no significant issues with environmental allergies unless exposed to  high pollen levels during activities like mulching leaves. He avoids mammalian meat due to his alpha-gal allergy and primarily consumes fish and chicken.  He does not smoke and has been in the same job for a while. No issues with breathing, eating, or bowel movements. No fever, chills, or other systemic symptoms.  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 The foot issues began before the recent increase in rash frequency.   Mainly occurs on his face. Describes them as red, flat. Individual rashes lasts about 15-20 minutes.  Associated symptoms include: none.  Frequency of episodes: daily. Suspected triggers are unknown. Denies any fevers, chills, changes in medications, foods, personal care products. He had a cold about 6 weeks ago. He has tried the following therapies: zyrtec 10mg  BID with minimal benefit. Systemic steroids: yes for the foot - arthritis.  Previous history of rash/hives: yes. Patient is up to date with the following cancer screening tests: physical exam, colonoscopy.  03/19/2016 labs Beef (Bos spp) IgE 0.59 High   Class Interpretation 1  Lamb/Mutton (Ovis spp) IgE 7.91 High   Class Interpretation 3  Pork (Sus spp) IgE 8.81 High    Environmental panel positive to cats, dog, grass, trees.   Assessment and Plan: Lee Hughes is a 64 y.o. male with: Chronic idiopathic urticaria Increased frequency of non-itchy facial rash. Cetirizine previously effective but symptoms exacerbated the last few months. Possible triggers: recent infection, stress. Based on clinical history, he likely has chronic idiopathic urticaria. Discussed with patient,  that urticaria is usually caused by release of histamine by cutaneous mast cells but sometimes it is non-histamine mediated. Explained that urticaria is not always associated with allergies. In most cases, the exact etiology for urticaria  can not be established and it is considered idiopathic. Discussed the role of adding omalizumab  (anti-IgE antibodies) in controlling urticaria in refractory patients.  Keep track of rashes and take pictures. See below for proper skin care. Use fragrance free and dye free products. No dryer sheets or fabric softener.   Start allegra (fexofenadine) 180mg  twice a day. May take up to 2 tablets twice a day.  If symptoms are not controlled or causes drowsiness let us  know. Start Pepcid  (famotidine ) 20mg  twice a day.  Avoid the following potential triggers: alcohol, tight clothing, NSAIDs, hot showers and getting overheated. See below for proper skin care.  Get bloodwork.  Allergy to alpha-gal Diagnosed in 2018, positive blood work, no recent reactions, continues avoidance, no current symptoms. Get updated alpha gal panel. Continue to avoid mammalian meat. For mild symptoms you can take over the counter antihistamines (zyrtec 10mg  to 20mg ) and monitor symptoms closely.  If symptoms worsen or if you have severe symptoms including breathing issues, throat closure, significant swelling, whole body hives, severe diarrhea and vomiting, lightheadedness then use epinephrine  and seek immediate medical care afterwards. Emergency action plan given.  Other allergic rhinitis 2028 labs positive to cat, dog, grass, trees.  Start environmental control measures as below.  Return in about 4 weeks (around 02/27/2024).  Meds ordered this encounter  Medications   famotidine  (PEPCID ) 20 MG tablet    Sig: Take 1 tablet (20 mg total) by mouth 2 (two) times daily.    Dispense:  60 tablet    Refill:  2   Lab Orders         Chronic Urticaria (CU) Eval         Tryptase         Thyroid  Cascade Profile         Comprehensive metabolic panel with GFR         Alpha-Gal Panel         ANA, IFA (with reflex)         Protein electrophoresis, serum         Protein Electrophoresis, Urine Rflx.      Other allergy  screening: Asthma: as a child Rhino conjunctivitis: yes Takes antihistamines as needed. 2018 Environmental panel positive to cats, dog, grass, trees.   Food allergy: yes Alpha gal  Medication allergy: no Hymenoptera allergy: no History of recurrent infections suggestive of immunodeficency: no  Diagnostics: None.  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   BPH with urinary obstruction 02/13/2019   History of bladder cancer 02/13/2019   Erectile dysfunction due to arterial insufficiency 02/13/2019   Depression    Hypercholesteremia    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   Arthritis    bilateral fingers   Asthma    as child, no longer problematic   Bladder cancer (HCC)    Status post surgery and immunotherapy 2018   Carpal  tunnel syndrome on right 12/16/2019   Depression    Fatty liver    Hypercholesteremia    Irritable bowel syndrome    Nephrolithiasis    Prediabetes    Throat swelling    from tick   Tongue swelling    from tick   Urticaria    from tick   Varicella zoster    Past Surgical History: Past Surgical History:  Procedure Laterality Date   COLONOSCOPY N/A 09/09/2012   Procedure: COLONOSCOPY;  Surgeon: Oneil DELENA Budge, MD;  Location: [facility];  Service: Gastroenterology;  Laterality: N/A;   COLONOSCOPY WITH PROPOFOL  N/A 02/12/2023   Procedure: COLONOSCOPY WITH PROPOFOL ;  Surgeon: Budge Oneil, MD;  Location: [facility];  Service: Gastroenterology;  Laterality: N/A;   EXTRACORPOREAL SHOCK WAVE LITHOTRIPSY Left 04/04/2021   Procedure: EXTRACORPOREAL SHOCK WAVE  LITHOTRIPSY (ESWL);  Surgeon: Sherrilee Belvie CROME, MD;  Location: [facility];  Service: Urology;  Laterality: Left;  pt being told by office to be NPO and arrive at 10:30 - no PAT   Left femur Left 1988   TRANSURETHRAL RESECTION OF BLADDER TUMOR N/A 12/18/2016   Procedure: CYSTOSCOPY TRANSURETHRAL RESECTION OF BLADDER TUMOR (TURBT);  Surgeon: Watt Rush, MD;  Location: [facility];  Service: Urology;  Laterality: N/A;   Medication List:  Current Outpatient Medications  Medication Sig Dispense Refill   aspirin 81 MG chewable tablet Chew 81 mg by mouth.     Cetirizine HCl 10 MG CAPS Take by mouth 2 (two) times daily.      Cholecalciferol (VITAMIN D -3) 125 MCG (5000 UT) TABS Take 2 tablets by mouth daily.      escitalopram  (LEXAPRO ) 5 MG tablet TAKE 1 TABLET BY MOUTH DAILY. 90 tablet 1   famotidine  (PEPCID ) 20 MG tablet Take 1 tablet (20 mg total) by mouth 2 (two) times daily. 60 tablet 2   Multiple Vitamin (MULTIVITAMIN) capsule Take by mouth daily.     omega-3 acid ethyl esters (LOVAZA ) 1 g capsule Take 2 capsules (2 g total) by mouth 2 (two) times daily. 120 capsule 3   Sodium Sulfate-Mag Sulfate-KCl (SUTAB ) 1479-225-188 MG TABS Use as directed by provider 24 tablet 0   tamsulosin  (FLOMAX ) 0.4 MG CAPS capsule Take 1 capsule (0.4 mg total) by mouth daily. 90 capsule 3   REPATHA SURECLICK 140 MG/ML SOAJ Inject into the skin.     No current facility-administered medications for this visit.   Allergies: Allergies[1] Social History: Social History   Socioeconomic History   Marital status: Married    Spouse name: Andrez   Number of children: Not on file   Years of education: Not on file   Highest education level: Associate degree: academic program  Occupational History   Not on file  Tobacco Use   Smoking status: Never   Smokeless tobacco: Never  Vaping Use   Vaping status: Never Used  Substance and Sexual Activity   Alcohol use: Yes    Comment: occas   Drug use: Never    Sexual activity: Not Currently  Other Topics Concern   Not on file  Social History Narrative   Married 19 years. Lives with wife. 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.   Social Drivers of Health   Tobacco Use: Low Risk (01/31/2024)   Patient History    Smoking Tobacco Use: Never    Smokeless Tobacco Use: Never    Passive Exposure: Not on file  Financial Resource Strain: Low Risk (01/17/2024)   Received from [facility]  Overall Financial Resource Strain (CARDIA)    How hard is it for you to pay for the very basics like food, housing, medical care, and heating?: Not hard at all  Food Insecurity: No Food Insecurity (01/17/2024)   Received from [facility]   Epic    Within the past 12 months, you worried that your food would run out before you got the money to buy more.: Never true    Within the past 12 months, the food you bought just didn't last and you didn't have money to get more.: Never true  Transportation Needs: No Transportation Needs (01/17/2024)   Received from [facility]    In the past 12 months, has lack of transportation kept you from medical appointments or from getting medications?: No    In the past 12 months, has lack of transportation kept you from meetings, work, or from getting things needed for daily living?: No  Physical Activity: Insufficiently Active (01/17/2024)   Received from [facility]   Exercise Vital Sign    On average, how many days per week do you engage in moderate to strenuous exercise (like a brisk walk)?: 2 days    On average, how many minutes do you engage in exercise at this level?: 30 min  Stress: No Stress Concern Present (01/17/2024)   Received from [facility] - Occupational Stress Questionnaire    Do you feel stress - tense, restless, nervous, or anxious, or unable to sleep at night because your mind is troubled all the time - these days?: Not at all  Social Connections: Socially Integrated  (01/17/2024)   Received from [facility]   Social Network    How would you rate your social network (family, work, friends)?: Good participation with social networks  Depression (PHQ2-9): Not on file  Alcohol Screen: Not on file  Housing: Low Risk (01/17/2024)   Received from [facility]    In the last 12 months, was there a time when you were not able to pay the mortgage or rent on time?: No    In the past 12 months, how many times have you moved where you were living?: 0    At any time in the past 12 months, were you homeless or living in a shelter (including now)?: No  Utilities: Not At Risk (01/17/2024)   Received from [facility]    In the past 12 months has the electric, gas, oil, or water  company threatened to shut off services in your home?: No  Health Literacy: Not on file   Lives in a house. Smoking: denies Occupation: Psychologist, Educational History: Water  Damage/mildew in the house: no Engineer, Civil (consulting) in the family room: no Carpet in the bedroom: no Heating: gas Cooling: central Pet: yes 1 cat  Family History: Family History  Problem Relation Age of Onset   Alcoholism Mother    Cirrhosis Mother    Alcoholism Father    Broken bones Father    Pancreatic cancer Brother    Pancreatic disease Brother    Dementia Paternal Aunt    Dementia Paternal Uncle    Stroke Maternal Grandmother    Cancer Maternal Grandfather    Diabetes Maternal Grandfather    Emphysema Paternal Grandfather    Allergic rhinitis Neg Hx    Angioedema Neg Hx    Asthma Neg Hx    Atopy Neg Hx    Eczema Neg  Hx    Immunodeficiency Neg Hx    Urticaria Neg Hx    Review of Systems  Constitutional:  Negative for appetite change, chills, fever and unexpected weight change.  HENT:  Negative for congestion and rhinorrhea.   Eyes:  Negative for itching.  Respiratory:  Negative for cough, chest tightness, shortness of breath and wheezing.   Cardiovascular:  Negative for chest  pain.  Gastrointestinal:  Negative for abdominal pain.  Genitourinary:  Negative for difficulty urinating.  Skin:  Positive for rash.  Allergic/Immunologic: Positive for environmental allergies and food allergies.  Neurological:  Negative for headaches.    Objective: BP 130/86   Pulse 64   Temp 97.6 F (36.4 C) (Temporal)   Resp 18   Ht 5' 10.5 (1.791 m)   Wt 205 lb (93 kg)   SpO2 97%   BMI 29.00 kg/m  Body mass index is 29 kg/m. Physical Exam Vitals and nursing note reviewed.  Constitutional:      Appearance: Normal appearance. He is well-developed.  HENT:     Head: Normocephalic and atraumatic.     Right Ear: Tympanic membrane and external ear normal.     Left Ear: Tympanic membrane and external ear normal.     Nose: Nose normal.     Mouth/Throat:     Mouth: Mucous membranes are moist.     Pharynx: Oropharynx is clear.  Eyes:     Conjunctiva/sclera: Conjunctivae normal.  Cardiovascular:     Rate and Rhythm: Normal rate and regular rhythm.     Heart sounds: Normal heart sounds. No murmur heard.    No friction rub. No gallop.  Pulmonary:     Effort: Pulmonary effort is normal.     Breath sounds: Normal breath sounds. No wheezing, rhonchi or rales.  Musculoskeletal:     Cervical back: Neck supple.  Skin:    General: Skin is warm.     Findings: No rash.  Neurological:     Mental Status: He is alert and oriented to person, place, and time.  Psychiatric:        Behavior: Behavior normal.    The plan was reviewed with the patient/family, and all questions/concerned were addressed.  It was my pleasure to see Lillard today and participate in his care. Please feel free to contact me with any questions or concerns.  Sincerely,  Orlan Cramp, DO Allergy & Immunology  [facility]  [facility] office: [phone] [facility] office: [phone]     [1]  Allergies Allergen Reactions   Meat Extract Anaphylaxis and Hives   No Known  Allergies    Other     No beef, pork, deer, products secondary from tick bite   "

## 2024-01-30 NOTE — Patient Instructions (Addendum)
 Hives: Based on clinical history, he likely has chronic idiopathic urticaria. Discussed with patient, that urticaria is usually caused by release of histamine by cutaneous mast cells but sometimes it is non-histamine mediated. Explained that urticaria is not always associated with allergies. In most cases, the exact etiology for urticaria can not be established and it is considered idiopathic. Discussed the role of adding omalizumab  (anti-IgE antibodies) in controlling urticaria in refractory patients. Informational pamphlet given.  Keep track of rashes and take pictures. See below for proper skin care. Use fragrance free and dye free products. No dryer sheets or fabric softener.    Start allegra (fexofenadine) 180mg  twice a day. May take up to 2 tablets twice a day.  If symptoms are not controlled or causes drowsiness let us  know. Start Pepcid  (famotidine ) 20mg  twice a day.  Avoid the following potential triggers: alcohol, tight clothing, NSAIDs, hot showers and getting overheated. See below for proper skin care.   Get bloodwork. We are ordering labs, so please allow 1-2 weeks for the results to come back. With the newly implemented Cures Act, the labs might be visible to you at the same time that they become visible to me. However, I will not address the results until all of the results are back, so please be patient.   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 Continue to avoid mammalian meat. For mild symptoms you can take over the counter antihistamines (zyrtec 10mg  to 20mg ) and monitor symptoms closely.  If symptoms worsen or if you have severe symptoms including breathing issues, throat closure, significant swelling, whole body hives, severe diarrhea and vomiting, lightheadedness then use epinephrine  and seek immediate medical care afterwards. Emergency action plan given.  Environmental allergies Start environmental control measures as below.   Return in about 4 weeks  (around 02/27/2024). Or sooner if needed.   Skin care recommendations  Bath time: Always use lukewarm water . AVOID very hot or cold water . Keep bathing time to 5-10 minutes. Do NOT use bubble bath. Use a mild soap and use just enough to wash the dirty areas. Do NOT scrub skin vigorously.  After bathing, pat dry your skin with a towel. Do NOT rub or scrub the skin.  Moisturizers and prescriptions:  ALWAYS apply moisturizers immediately after bathing (within 3 minutes). This helps to lock-in moisture. Use the moisturizer several times a day over the whole body. Good summer moisturizers include: Aveeno, CeraVe, Cetaphil. Good winter moisturizers include: Aquaphor, Vaseline, Cerave, Cetaphil, Eucerin, Vanicream. When using moisturizers along with medications, the moisturizer should be applied about one hour after applying the medication to prevent diluting effect of the medication or moisturize around where you applied the medications. When not using medications, the moisturizer can be continued twice daily as maintenance.  Laundry and clothing: Avoid laundry products with added color or perfumes. Use unscented hypo-allergenic laundry products such as Tide free, Cheer free & gentle, and All free and clear.  If the skin still seems dry or sensitive, you can try double-rinsing the clothes. Avoid tight or scratchy clothing such as wool. Do not use fabric softeners or dyer sheets.  Reducing Pollen Exposure Pollen seasons: trees (spring), grass (summer) and ragweed/weeds (fall). Keep windows closed in your home and car to lower pollen exposure.  Install air conditioning in the bedroom and throughout the house if possible.  Avoid going out in dry windy days - especially early morning. Pollen counts are highest between 5 - 10 AM and on dry, hot and windy  days.  Save outside activities for late afternoon or after a heavy rain, when pollen levels are lower.  Avoid mowing of grass if you have grass  pollen allergy. Be aware that pollen can also be transported indoors on people and pets.  Dry your clothes in an automatic dryer rather than hanging them outside where they might collect pollen.  Rinse hair and eyes before bedtime.  Pet Allergen Avoidance: Contrary to popular opinion, there are no hypoallergenic breeds of dogs or cats. That is because people are not allergic to an animals hair, but to an allergen found in the animal's saliva, dander (dead skin flakes) or urine. Pet allergy symptoms typically occur within minutes. For some people, symptoms can build up and become most severe 8 to 12 hours after contact with the animal. People with severe allergies can experience reactions in public places if dander has been transported on the pet owners clothing. Keeping an animal outdoors is only a partial solution, since homes with pets in the yard still have higher concentrations of animal allergens. Before getting a pet, ask your allergist to determine if you are allergic to animals. If your pet is already considered part of your family, try to minimize contact and keep the pet out of the bedroom and other rooms where you spend a great deal of time. As with dust mites, vacuum carpets often or replace carpet with a hardwood floor, tile or linoleum. High-efficiency particulate air (HEPA) cleaners can reduce allergen levels over time. While dander and saliva are the source of cat and dog allergens, urine is the source of allergens from rabbits, hamsters, mice and guinea pigs; so ask a non-allergic family member to clean the animals cage. If you have a pet allergy, talk to your allergist about the potential for allergy immunotherapy (allergy shots). This strategy can often provide long-term relief.  Alpha-gal and Red Meat Allergy   Overview An allergy to alpha-gal refers to having a severe and potentially life-threatening allergy to a carbohydrate molecule called galactose-alpha-1,3-galactose  that is found in most mammalian or red meat. Unlike other food allergies which typically occur within minutes of ingestion, symptoms from eating red meat such as pork, lamb or beef may be delayed, occurring 3-8 hours after eating. Most food allergies are directed against a protein molecule, but alpha-gal is unusual because it is a carbohydrate, and a delay in its absorption may explain the delay in symptoms.  Helpful website: bikerfestival.is  What are the symptoms of an alpha-gal allergy? As with other food allergies, signs or symptoms of an allergy to alpha-gal may include: Hives and itching  Swelling of your lips, face or eyelids  Shortness of breath, cough or wheezing  Abdominal pain, nausea, diarrhea or vomiting The most severe reaction, anaphylaxis, can present as a combination of several of these symptoms, may include low blood pressure, and is potentially fatal.  Because these symptoms are delayed, you may only wake up with them in the middle of the night after an evening meal.  How is an alpha-gal allergy diagnosed? Diagnosis of this allergy starts with your allergist taking an appropriate history and physical examination. Because the onset is usually quite delayed, it can be hard to associate the symptoms with eating red meat many hours previously. Triggers include any red meat - including beef, pork, lamb or even horse products. It may occur after eating hotdogs and hamburgers. In very rare cases the reaction may extend to milk or dairy proteins and gelatin.  Your allergist  may recommend testing that includes skin tests to the relevant animal proteins and blood tests which measure the levels of a specific immunoglobulin E (IgE) antibody, to mammalian meats. An investigational blood test, IgE against alpha-gal itself, may also aid in the diagnosis.  How is an alpha-gal allergy treated? Immediate symptoms such as hives or shortness of breath are treated the same as any other  food allergy - in an urgent care setting with anti-histamines, epinephrine  and other medications. Prevention long-term involves avoidance of all red meat in sensitized individuals. You may be advised to carry an epinephrine  auto-injector, to be used in case of subsequent accidental exposures and reaction. These measures do not necessarily mean switching to a full vegetarian diet, since poultry and fish can be consumed and do not cause similar reactions. As with other food allergies, there is the possibility that over time the sensitivity diminishes - although these changes may take many years to become apparent.  How do you become allergic to alpha-gal? Alpha-gal is a molecule carried in the saliva of the [place] tick and other potential arthropods typically after feeding on mammalian blood. People that are bitten by the tick, especially those that are bitten repeatedly, are at risk of becoming sensitized and producing the IgE necessary to then cause allergic reactions. Interestingly, allergic reactions may occur to red meat, to subsequent tick bites, and even to medications that contain alpha-gal. Cetuximab is a cancer medication that contains alpha-gal, and people who have had allergic reactions to this medication (these are typically immediate reactions, because it is infused intravenously) have a higher risk for red meat allergy and are likely to have been bitten by ticks in the past. As might be expected, the incidence of tick bites is much higher in the [place] the traditional habitat for the tick. However, cases are now increasingly reported in the northern and western states. And it is a phenomenon that has been observed worldwide, with different ticks responsible for similar cases of red meat allergy in many other countries such as [place], [place] and [place].  The discovery of this peculiar allergy has allowed researchers to correlate tick bites with many cases of  anaphylaxis that would previously have been classified as idiopathic, or of unknown cause. Also, while it was originally thought that the [employer] tick had to feast on mammalian blood in order to carry the alpha-gal molecule, more recent research has shown that it may carry this molecule and be capable of sensitizing humans independently.  How do you prevent an alpha-gal allergy? Because this allergy is predominantly tick born, you are more likely at risk if you often go outdoors in wooded areas for activities such as hiking, fishing or hunting. The key strategy is to prevent tick bites. This may include wearing long sleeved shirts or pants, using appropriate insect repellants, and surveying for ticks after spending time outdoors. Any observed ticks should be removed carefully by cleaning the site with rubbing alcohol, then using tweezers to pull the ticks head up carefully from the skin using steady pressure. Clean your hands and the site one more time and make sure not to crush the tick between your fingers.

## 2024-01-31 ENCOUNTER — Encounter: Payer: Self-pay | Admitting: Allergy

## 2024-02-27 ENCOUNTER — Ambulatory Visit: Admitting: Allergy

## 2024-11-02 ENCOUNTER — Other Ambulatory Visit

## 2024-11-09 ENCOUNTER — Other Ambulatory Visit: Admitting: Urology
